# Patient Record
Sex: Male | Born: 1960 | Race: White | Hispanic: No | Marital: Married | Smoking: Current every day smoker
Health system: Southern US, Community
[De-identification: ages and names within clinical notes are randomized; demographics above are authoritative.]

## PROBLEM LIST (undated history)

## (undated) DIAGNOSIS — Z8679 Personal history of other diseases of the circulatory system: Secondary | ICD-10-CM

## (undated) DIAGNOSIS — Z9989 Dependence on other enabling machines and devices: Secondary | ICD-10-CM

## (undated) DIAGNOSIS — I5022 Chronic systolic (congestive) heart failure: Secondary | ICD-10-CM

## (undated) DIAGNOSIS — E119 Type 2 diabetes mellitus without complications: Secondary | ICD-10-CM

## (undated) DIAGNOSIS — F17200 Nicotine dependence, unspecified, uncomplicated: Secondary | ICD-10-CM

## (undated) DIAGNOSIS — G4733 Obstructive sleep apnea (adult) (pediatric): Secondary | ICD-10-CM

## (undated) DIAGNOSIS — I249 Acute ischemic heart disease, unspecified: Secondary | ICD-10-CM

## (undated) HISTORY — DX: Dependence on other enabling machines and devices: Z99.89

## (undated) HISTORY — DX: Nicotine dependence, unspecified, uncomplicated: F17.200

## (undated) HISTORY — DX: Personal history of other diseases of the circulatory system: Z86.79

## (undated) HISTORY — DX: Type 2 diabetes mellitus without complications: E11.9

## (undated) HISTORY — DX: Chronic systolic (congestive) heart failure: I50.22

## (undated) HISTORY — DX: Acute ischemic heart disease, unspecified: I24.9

## (undated) HISTORY — DX: Obstructive sleep apnea (adult) (pediatric): G47.33

## (undated) HISTORY — DX: Morbid (severe) obesity due to excess calories: E66.01

## (undated) HISTORY — PX: TRANSTHORACIC ECHOCARDIOGRAM: SHX275

---

## 2012-12-21 ENCOUNTER — Ambulatory Visit (INDEPENDENT_AMBULATORY_CARE_PROVIDER_SITE_OTHER): Payer: BC Managed Care – PPO | Admitting: Family Medicine

## 2012-12-21 ENCOUNTER — Encounter: Payer: Self-pay | Admitting: Family Medicine

## 2012-12-21 DIAGNOSIS — F17200 Nicotine dependence, unspecified, uncomplicated: Secondary | ICD-10-CM | POA: Insufficient documentation

## 2012-12-21 HISTORY — DX: Morbid (severe) obesity due to excess calories: E66.01

## 2012-12-21 NOTE — Progress Notes (Addendum)
Office Note 12/21/2012  CC:  Chief Complaint  Patient presents with  . Establish Care    HPI:  Jesse Hurst is a 52 y.o. White male who is here to establish care. Patient's most recent primary MD: none Old records were not reviewed prior to or during today's visit.  He is feeling well, has no acute complaints.  Says he will be taking a cruise and he has to have a note saying he does not have any illnesses that would preclude safe travel.  Pt denies any past significant acute or chronic illnesses, hospitalizations, procedures, or surgeries. Past Medical History  Diagnosis Date  . Obesity, morbid 12/21/2012  . Tobacco dependence 12/21/2012    History reviewed. No pertinent past surgical history.  Family History  Problem Relation Age of Onset  . Cancer Mother     Lung (heavy smoker)  d. 41  . Cancer Father     Lung cancer (heavy smoker)  d 76  . Drug abuse Son     History   Social History  . Marital Status: Married    Spouse Name: N/A    Number of Children: N/A  . Years of Education: N/A   Occupational History  . Not on file.   Social History Main Topics  . Smoking status: Current Every Day Smoker -- 1.00 packs/day for 30 years    Types: Cigarettes  . Smokeless tobacco: Never Used  . Alcohol Use: No  . Drug Use: No  . Sexual Activity: Not on file   Other Topics Concern  . Not on file   Social History Narrative   Married, 4 children.  3 living (son died of drug overdose).   Occupation: plumber--owns plumbing business.  Lives in [place], [place].   Education: GED.   Hobbies: Fish and golf.   +Cigarettes: 45 pack-yr hx [place] lights   Alcohol: none   Drugs: none   MEDS: none  No Known Allergies  ROS Review of Systems  Constitutional: Negative for fever and fatigue.  HENT: Negative for congestion and sore throat.   Eyes: Negative for visual disturbance.  Respiratory: Negative for cough.   Cardiovascular: Negative for chest pain.  Gastrointestinal:  Negative for nausea and abdominal pain.  Genitourinary: Negative for dysuria.  Musculoskeletal: Negative for back pain and joint swelling.  Skin: Negative for rash.  Neurological: Negative for weakness and headaches.  Hematological: Negative for adenopathy.     PE; Blood pressure 154/100, pulse 82, temperature 98.9 F (37.2 C), temperature source Temporal, resp. rate 20, height 6\' 1"  (1.854 m), weight 422 lb (191.418 kg), SpO2 95.00%. Initial bp was taken on wrist with an automated bp machine. I checked his bp with manual cuff (extra large) on upper arm and got 124/84 right arm, 120/80 left arm. Gen: Alert, well appearing, obese white male.  Patient is oriented to person, place, time, and situation. ENT: Eyes: no injection, icteris, swelling, or exudate.  EOMI, PERRLA. Nose: no drainage or turbinate edema/swelling.  No injection or focal lesion.  Mouth: lips without lesion/swelling.  Oral mucosa pink and moist.  Dentition intact and without obvious caries or gingival swelling.  Oropharynx without erythema, exudate, or swelling.  Neck - No masses or thyromegaly or limitation in range of motion CV: RRR, no m/r/g.   LUNGS: CTA bilat, nonlabored resps, good aeration in all lung fields. ABD: soft, NT, ND but rotund/obese, BS normal.  No hepatospenomegaly or mass.  No bruits. EXT: no clubbing, cyanosis, or edema.   Pertinent  labs:  none  ASSESSMENT AND PLAN:   New pt: no old records to obtain.  Obesity, morbid Pt saw his weight today and is now motivated to start diet (discussed DASH diet and gave handout) and exercising (walking, treadmill, lifting weights). He says when he puts his mind to something he does it. He'll get the labs he got with his life insurance CPE about 9 mo ago and drop them by our office or mail them to [place]. We'll have him return for fasting CPE in 3 mo--will do fasting HP then and discuss PSA testing and options for colon cancer screening. Letter written for him today  stating he was healthy for travel.  Tobacco dependence Encouraged cessation but this was not up for discussion today. "I like to smoke" was a comment he made. Perhaps after we see some good wt loss we'll try to tackle this problem.  An After Visit Summary was printed and given to the patient.  Pt declined flu vaccine here, says he gets them at pharmacy.  Return in about 3 months (around 03/23/2013) for fasting CPE.

## 2012-12-21 NOTE — Assessment & Plan Note (Signed)
Encouraged cessation but this was not up for discussion today. "I like to smoke" was a comment he made. Perhaps after we see some good wt loss we'll try to tackle this problem.

## 2012-12-21 NOTE — Assessment & Plan Note (Signed)
Pt saw his weight today and is now motivated to start diet (discussed DASH diet and gave handout) and exercising (walking, treadmill, lifting weights). He says when he puts his mind to something he does it. He'll get the labs he got with his life insurance CPE about 9 mo ago and drop them by our office or mail them to [place]. We'll have him return for fasting CPE in 3 mo--will do fasting HP then and discuss PSA testing and options for colon cancer screening. Letter written for him today stating he was healthy for travel.

## 2012-12-24 ENCOUNTER — Encounter: Payer: Self-pay | Admitting: Family Medicine

## 2013-02-14 HISTORY — PX: CARDIAC CATHETERIZATION: SHX172

## 2013-02-22 ENCOUNTER — Inpatient Hospital Stay (HOSPITAL_COMMUNITY)
Admission: EM | Admit: 2013-02-22 | Discharge: 2013-02-24 | DRG: 281 | Disposition: A | Discharge status: Home / Self Care | Payer: BC Managed Care – PPO | Attending: Cardiology | Admitting: Cardiology

## 2013-02-22 DIAGNOSIS — Z6841 Body Mass Index (BMI) 40.0 and over, adult: Secondary | ICD-10-CM

## 2013-02-22 DIAGNOSIS — N19 Unspecified kidney failure: Secondary | ICD-10-CM

## 2013-02-22 DIAGNOSIS — F17200 Nicotine dependence, unspecified, uncomplicated: Secondary | ICD-10-CM | POA: Diagnosis present

## 2013-02-22 DIAGNOSIS — I471 Supraventricular tachycardia, unspecified: Secondary | ICD-10-CM | POA: Diagnosis present

## 2013-02-22 DIAGNOSIS — I249 Acute ischemic heart disease, unspecified: Secondary | ICD-10-CM | POA: Diagnosis present

## 2013-02-22 DIAGNOSIS — I214 Non-ST elevation (NSTEMI) myocardial infarction: Principal | ICD-10-CM | POA: Diagnosis present

## 2013-02-22 HISTORY — DX: Acute ischemic heart disease, unspecified: I24.9

## 2013-02-23 ENCOUNTER — Emergency Department (HOSPITAL_COMMUNITY): Payer: BC Managed Care – PPO

## 2013-02-23 ENCOUNTER — Inpatient Hospital Stay (HOSPITAL_COMMUNITY): Payer: BC Managed Care – PPO

## 2013-02-23 ENCOUNTER — Encounter (HOSPITAL_COMMUNITY): Payer: Self-pay | Admitting: Emergency Medicine

## 2013-02-23 DIAGNOSIS — I249 Acute ischemic heart disease, unspecified: Secondary | ICD-10-CM | POA: Diagnosis present

## 2013-02-23 LAB — PRO B NATRIURETIC PEPTIDE
Pro B Natriuretic peptide (BNP): 1106 pg/mL — ABNORMAL HIGH (ref 0–125)
Pro B Natriuretic peptide (BNP): 1115 pg/mL — ABNORMAL HIGH (ref 0–125)

## 2013-02-23 LAB — POCT I-STAT, CHEM 8
BUN: 17 mg/dL (ref 6–23)
Calcium, Ion: 1.17 mmol/L (ref 1.12–1.23)
Chloride: 105 mEq/L (ref 96–112)
Creatinine, Ser: 1.5 mg/dL — ABNORMAL HIGH (ref 0.50–1.35)
Sodium: 141 mEq/L (ref 135–145)

## 2013-02-23 LAB — COMPREHENSIVE METABOLIC PANEL
ALT: 18 U/L (ref 0–53)
AST: 20 U/L (ref 0–37)
Albumin: 3.3 g/dL — ABNORMAL LOW (ref 3.5–5.2)
Alkaline Phosphatase: 71 U/L (ref 39–117)
CO2: 26 mEq/L (ref 19–32)
Calcium: 8.5 mg/dL (ref 8.4–10.5)
Chloride: 103 mEq/L (ref 96–112)
GFR calc Af Amer: >90 mL/min (ref >90)
GFR calc non Af Amer: >90 mL/min (ref >90)
Glucose, Bld: 119 mg/dL — ABNORMAL HIGH (ref 70–99)
Potassium: 3.9 mEq/L (ref 3.5–5.1)
Sodium: 138 mEq/L (ref 135–145)
Total Protein: 7 g/dL (ref 6.0–8.3)

## 2013-02-23 LAB — BASIC METABOLIC PANEL
CO2: 25 mEq/L (ref 19–32)
Chloride: 107 mEq/L (ref 96–112)
Creatinine, Ser: 1.13 mg/dL (ref 0.50–1.35)
GFR calc Af Amer: 85 mL/min — ABNORMAL LOW (ref >90)
Glucose, Bld: 108 mg/dL — ABNORMAL HIGH (ref 70–99)
Potassium: 4.2 mEq/L (ref 3.5–5.1)
Sodium: 138 mEq/L (ref 135–145)

## 2013-02-23 LAB — TSH
TSH: 3.654 u[IU]/mL (ref 0.350–4.500)
TSH: 1.509 u[IU]/mL (ref 0.350–4.500)

## 2013-02-23 LAB — CBC WITH DIFFERENTIAL/PLATELET
Basophils Absolute: 0.1 10*3/uL (ref 0.0–0.1)
Eosinophils Relative: 2 % (ref 0–5)
HCT: 44.1 % (ref 39.0–52.0)
Hemoglobin: 13.7 g/dL (ref 13.0–17.0)
Hemoglobin: 14.7 g/dL (ref 13.0–17.0)
Lymphocytes Relative: 22 % (ref 12–46)
Lymphocytes Relative: 33 % (ref 12–46)
Lymphs Abs: 3 10*3/uL (ref 0.7–4.0)
Lymphs Abs: 3.5 10*3/uL (ref 0.7–4.0)
MCV: 90.6 fL (ref 78.0–100.0)
MCV: 91 fL (ref 78.0–100.0)
Monocytes Relative: 9 % (ref 3–12)
Neutro Abs: 9.1 10*3/uL — ABNORMAL HIGH (ref 1.7–7.7)
Neutrophils Relative %: 59 % (ref 43–77)
Platelets: 264 10*3/uL (ref 150–400)
Platelets: 305 10*3/uL (ref 150–400)
RBC: 4.55 MIL/uL (ref 4.22–5.81)
RBC: 4.87 MIL/uL (ref 4.22–5.81)
RDW: 14.2 % (ref 11.5–15.5)
WBC: 10.9 10*3/uL — ABNORMAL HIGH (ref 4.0–10.5)
WBC: 13.6 10*3/uL — ABNORMAL HIGH (ref 4.0–10.5)

## 2013-02-23 LAB — POCT I-STAT TROPONIN I: Troponin i, poc: 0.66 ng/mL (ref 0.00–0.08)

## 2013-02-23 LAB — TROPONIN I
Troponin I: 1.67 ng/mL (ref <0.30)
Troponin I: 0.96 ng/mL (ref <0.30)

## 2013-02-23 LAB — LIPID PANEL
LDL Cholesterol: 86 mg/dL (ref 0–99)
Total CHOL/HDL Ratio: 5.2 RATIO
VLDL: 20 mg/dL (ref 0–40)

## 2013-02-23 LAB — PROTIME-INR
INR: 1.14 (ref 0.00–1.49)
Prothrombin Time: 14.4 seconds (ref 11.6–15.2)
Prothrombin Time: 14.4 seconds (ref 11.6–15.2)

## 2013-02-23 LAB — CBC
MCV: 90.7 fL (ref 78.0–100.0)
Platelets: 276 10*3/uL (ref 150–400)
RBC: 4.39 MIL/uL (ref 4.22–5.81)
WBC: 12.1 10*3/uL — ABNORMAL HIGH (ref 4.0–10.5)

## 2013-02-23 LAB — APTT: aPTT: 45 seconds — ABNORMAL HIGH (ref 24–37)

## 2013-02-23 LAB — D-DIMER, QUANTITATIVE: D-Dimer, Quant: 2.13 ug/mL-FEU — ABNORMAL HIGH (ref 0.00–0.48)

## 2013-02-23 MED ORDER — NITROGLYCERIN IN D5W 200-5 MCG/ML-% IV SOLN: 5.0000 ug/min | INTRAVENOUS | Status: DC

## 2013-02-23 MED ORDER — DIAZEPAM 5 MG PO TABS
5.0000 mg | ORAL_TABLET | ORAL | Status: AC
  Administered 2013-02-24: 5 mg via ORAL
  Filled 2013-02-23: qty 1

## 2013-02-23 MED ORDER — ACETAMINOPHEN 325 MG PO TABS: 650.0000 mg | ORAL_TABLET | ORAL | Status: DC | PRN

## 2013-02-23 MED ORDER — HEPARIN (PORCINE) IN NACL 100-0.45 UNIT/ML-% IJ SOLN
1500.0000 [IU]/h | INTRAMUSCULAR | Status: DC
  Administered 2013-02-23: 1500 [IU]/h via INTRAVENOUS
  Filled 2013-02-23 (×2): qty 250

## 2013-02-23 MED ORDER — SODIUM CHLORIDE 0.9 % IJ SOLN
3.0000 mL | Freq: Two times a day (BID) | INTRAMUSCULAR | Status: DC
  Administered 2013-02-23: 3 mL via INTRAVENOUS

## 2013-02-23 MED ORDER — PANTOPRAZOLE SODIUM 40 MG PO TBEC
40.0000 mg | DELAYED_RELEASE_TABLET | Freq: Every day | ORAL | Status: DC
  Administered 2013-02-23 – 2013-02-24 (×2): 40 mg via ORAL
  Filled 2013-02-23 (×2): qty 1

## 2013-02-23 MED ORDER — HEPARIN BOLUS VIA INFUSION
3500.0000 [IU] | Freq: Once | INTRAVENOUS | Status: AC
  Administered 2013-02-23: 3500 [IU] via INTRAVENOUS
  Filled 2013-02-23: qty 3500

## 2013-02-23 MED ORDER — SODIUM CHLORIDE 0.9 % IV SOLN: 250.0000 mL | INTRAVENOUS | Status: DC | PRN

## 2013-02-23 MED ORDER — ATORVASTATIN CALCIUM 80 MG PO TABS
80.0000 mg | ORAL_TABLET | Freq: Every day | ORAL | Status: DC
  Administered 2013-02-23: 80 mg via ORAL
  Filled 2013-02-23 (×2): qty 1

## 2013-02-23 MED ORDER — NITROGLYCERIN 0.4 MG SL SUBL: 0.4000 mg | SUBLINGUAL_TABLET | SUBLINGUAL | Status: DC | PRN

## 2013-02-23 MED ORDER — SODIUM CHLORIDE 0.9 % IV BOLUS (SEPSIS): 500.0000 mL | Freq: Once | INTRAVENOUS | Status: DC

## 2013-02-23 MED ORDER — SODIUM CHLORIDE 0.9 % IV SOLN: INTRAVENOUS | Status: DC

## 2013-02-23 MED ORDER — HEPARIN (PORCINE) IN NACL 100-0.45 UNIT/ML-% IJ SOLN
2300.0000 [IU]/h | INTRAMUSCULAR | Status: DC
  Administered 2013-02-23: 2000 [IU]/h via INTRAVENOUS
  Administered 2013-02-23 (×2): 2300 [IU]/h via INTRAVENOUS
  Filled 2013-02-23: qty 250

## 2013-02-23 MED ORDER — ASPIRIN EC 81 MG PO TBEC
81.0000 mg | DELAYED_RELEASE_TABLET | Freq: Every day | ORAL | Status: DC
  Filled 2013-02-23: qty 1

## 2013-02-23 MED ORDER — METOPROLOL TARTRATE 12.5 MG HALF TABLET
12.5000 mg | ORAL_TABLET | Freq: Two times a day (BID) | ORAL | Status: DC
  Administered 2013-02-23 – 2013-02-24 (×3): 12.5 mg via ORAL
  Filled 2013-02-23 (×5): qty 1

## 2013-02-23 MED ORDER — IOHEXOL 350 MG/ML SOLN
100.0000 mL | Freq: Once | INTRAVENOUS | Status: AC | PRN
  Administered 2013-02-23: 100 mL via INTRAVENOUS

## 2013-02-23 MED ORDER — ONDANSETRON HCL 4 MG/2ML IJ SOLN: 4.0000 mg | Freq: Four times a day (QID) | INTRAMUSCULAR | Status: DC | PRN

## 2013-02-23 MED ORDER — ASPIRIN 300 MG RE SUPP: 300.0000 mg | RECTAL | Status: AC

## 2013-02-23 MED ORDER — SODIUM CHLORIDE 0.9 % IV BOLUS (SEPSIS)
500.0000 mL | Freq: Once | INTRAVENOUS | Status: AC
  Administered 2013-02-23: 500 mL via INTRAVENOUS

## 2013-02-23 MED ORDER — ASPIRIN 81 MG PO CHEW: 324.0000 mg | CHEWABLE_TABLET | Freq: Once | ORAL | Status: DC

## 2013-02-23 MED ORDER — SODIUM CHLORIDE 0.9 % IJ SOLN: 3.0000 mL | INTRAMUSCULAR | Status: DC | PRN

## 2013-02-23 MED ORDER — DIAZEPAM 5 MG PO TABS: 5.0000 mg | ORAL_TABLET | ORAL | Status: DC

## 2013-02-23 MED ORDER — ASPIRIN 81 MG PO CHEW
81.0000 mg | CHEWABLE_TABLET | ORAL | Status: AC
  Administered 2013-02-24: 81 mg via ORAL
  Filled 2013-02-23: qty 1

## 2013-02-23 MED ORDER — HEPARIN BOLUS VIA INFUSION
4000.0000 [IU] | Freq: Once | INTRAVENOUS | Status: AC
  Administered 2013-02-23: 4000 [IU] via INTRAVENOUS
  Filled 2013-02-23: qty 4000

## 2013-02-23 MED ORDER — NITROGLYCERIN IN D5W 200-5 MCG/ML-% IV SOLN
10.0000 ug/min | INTRAVENOUS | Status: DC
  Administered 2013-02-23: 10 ug/min via INTRAVENOUS
  Filled 2013-02-23: qty 250

## 2013-02-23 MED ORDER — ASPIRIN 81 MG PO CHEW
324.0000 mg | CHEWABLE_TABLET | ORAL | Status: AC
  Administered 2013-02-23: 324 mg via ORAL
  Filled 2013-02-23: qty 4

## 2013-02-23 MED ORDER — METOPROLOL TARTRATE 12.5 MG HALF TABLET
12.5000 mg | ORAL_TABLET | Freq: Two times a day (BID) | ORAL | Status: DC
  Filled 2013-02-23: qty 1

## 2013-02-23 MED ORDER — HEPARIN BOLUS VIA INFUSION
3000.0000 [IU] | Freq: Once | INTRAVENOUS | Status: AC
  Administered 2013-02-23: 3000 [IU] via INTRAVENOUS
  Filled 2013-02-23: qty 3000

## 2013-02-23 NOTE — ED Notes (Signed)
Notified Md of dropping BP

## 2013-02-23 NOTE — H&P (Signed)
Jesse Hurst is an 52 y.o. male.   Chief Complaint: Palpitation associated with shortness of breath and diaphoresis HPI: Patient is 52 year old male with no significant past medical history except for tobacco abuse and morbid obesity came to the ER by EMS complaining of dizziness shortness of breath and palpitation off and on since yesterday afternoon called EMS and was noted to be in supraventricular tachycardia with heart rate in 200s and spontaneously converted to normal sinus rhythm. Patient denies any chest pain nausea or vomiting. Denies history of palpitations or shortness of breath in the past he did denies PND orthopnea leg swelling. Denies any prolonged immobilization. EKG done in the ER course cardioversion showed no acute ischemic changes but patient was noted to have a mildly elevated troponin I. of 1.6. Patient denies any cardiac workup in the past.  Past Medical History  Diagnosis Date  . Obesity, morbid 12/21/2012  . Tobacco dependence 12/21/2012    History reviewed. No pertinent past surgical history.  Family History  Problem Relation Age of Onset  . Cancer Mother     Lung (heavy smoker)  d. 49  . Cancer Father     Lung cancer (heavy smoker)  d 85  . Drug abuse Son    Social History:  reports that he has been smoking Cigarettes.  He has a 30 pack-year smoking history. He has never used smokeless tobacco. He reports that he does not drink alcohol or use illicit drugs.  Allergies: No Known Allergies   (Not in a hospital admission)  Results for orders placed during the hospital encounter of 02/22/13 (from the past 48 hour(s))  CBC WITH DIFFERENTIAL     Status: Abnormal   Collection Time    02/23/13 12:05 AM      Result Value Range   WBC 13.6 (*) 4.0 - 10.5 K/uL   RBC 4.87  4.22 - 5.81 MIL/uL   Hemoglobin 14.7  13.0 - 17.0 g/dL   HCT 16.1  09.6 - 04.5 %   MCV 90.6  78.0 - 100.0 fL   MCH 30.2  26.0 - 34.0 pg   MCHC 33.3  30.0 - 36.0 g/dL   RDW 40.9  81.1 - 91.4 %   Platelets 305  150 - 400 K/uL   Neutrophils Relative % 67  43 - 77 %   Neutro Abs 9.1 (*) 1.7 - 7.7 K/uL   Lymphocytes Relative 22  12 - 46 %   Lymphs Abs 3.0  0.7 - 4.0 K/uL   Monocytes Relative 9  3 - 12 %   Monocytes Absolute 1.2 (*) 0.1 - 1.0 K/uL   Eosinophils Relative 2  0 - 5 %   Eosinophils Absolute 0.2  0.0 - 0.7 K/uL   Basophils Relative 0  0 - 1 %   Basophils Absolute 0.1  0.0 - 0.1 K/uL  PRO B NATRIURETIC PEPTIDE     Status: Abnormal   Collection Time    02/23/13 12:05 AM      Result Value Range   Pro B Natriuretic peptide (BNP) 1106.0 (*) 0 - 125 pg/mL  POCT I-STAT TROPONIN I     Status: Abnormal   Collection Time    02/23/13 12:11 AM      Result Value Range   Troponin i, poc 0.66 (*) 0.00 - 0.08 ng/mL   Comment NOTIFIED PHYSICIAN     Comment 3            Comment: Due to the release kinetics of  cTnI,     a negative result within the first hours     of the onset of symptoms does not rule out     myocardial infarction with certainty.     If myocardial infarction is still suspected,     repeat the test at appropriate intervals.  POCT I-STAT, CHEM 8     Status: Abnormal   Collection Time    02/23/13 12:13 AM      Result Value Range   Sodium 141  135 - 145 mEq/L   Potassium 4.3  3.5 - 5.1 mEq/L   Chloride 105  96 - 112 mEq/L   BUN 17  6 - 23 mg/dL   Creatinine, Ser 4.09 (*) 0.50 - 1.35 mg/dL   Glucose, Bld 811 (*) 70 - 99 mg/dL   Calcium, Ion 9.14  7.82 - 1.23 mmol/L   TCO2 24  0 - 100 mmol/L   Hemoglobin 16.0  13.0 - 17.0 g/dL   HCT 95.6  21.3 - 08.6 %  TROPONIN I     Status: Abnormal   Collection Time    02/23/13 12:23 AM      Result Value Range   Troponin I 1.67 (*) <0.30 ng/mL   Comment:            Due to the release kinetics of cTnI,     a negative result within the first hours     of the onset of symptoms does not rule out     myocardial infarction with certainty.     If myocardial infarction is still suspected,     repeat the test at appropriate  intervals.     CRITICAL RESULT CALLED TO, READ BACK BY AND VERIFIED WITH:     NEGRON,J RN 02/23/2013 0117 JORDANS     REPEATED TO VERIFY   Dg Chest 2 View  02/23/2013   CLINICAL DATA:  Shortness of breath, chest pain.  EXAM: CHEST  2 VIEW  COMPARISON:  None available for comparison at time of study interpretation.  FINDINGS: Cardiomediastinal silhouette is unremarkable. The lungs are clear without pleural effusions or focal consolidations. Pulmonary vasculature is unremarkable. Trachea projects midline and there is no pneumothorax. Soft tissue planes and included osseous structures are nonsuspicious. Large body habitus.  IMPRESSION: No active cardiopulmonary disease.   Electronically Signed   By: Awilda Metro   On: 02/23/2013 00:58    Review of Systems  Constitutional: Negative for fever, chills and weight loss.  HENT: Negative for hearing loss.   Eyes: Negative for blurred vision.  Respiratory: Positive for shortness of breath. Negative for cough, hemoptysis and sputum production.   Cardiovascular: Positive for palpitations. Negative for chest pain, orthopnea, claudication, leg swelling and PND.  Gastrointestinal: Negative for nausea and vomiting.  Genitourinary: Negative for dysuria.  Neurological: Positive for dizziness. Negative for headaches.    Blood pressure 107/71, pulse 84, temperature 98.3 F (36.8 C), temperature source Oral, resp. rate 21, SpO2 96.00%. Physical Exam  Constitutional: He is oriented to person, place, and time. He appears well-developed and well-nourished.  HENT:  Head: Normocephalic and atraumatic.  Eyes: Conjunctivae are normal. Pupils are equal, round, and reactive to light. Left eye exhibits no discharge. No scleral icterus.  Neck: Normal range of motion. Neck supple. No JVD present. No tracheal deviation present. No thyromegaly present.  Cardiovascular: Normal rate and regular rhythm.  Exam reveals no friction rub.   No murmur heard. Respiratory:  Breath sounds normal. No respiratory distress. He  has no wheezes. He has no rales.  GI: Soft. Bowel sounds are normal. He exhibits distension. There is no tenderness. There is no rebound and no guarding.  Musculoskeletal: He exhibits no edema.  Neurological: He is alert and oriented to person, place, and time.     Assessment/Plan Probable small non-Q-wave myocardial infarction secondary to paroxysmal SVT due to demand ischemia rule out coronary artery disease Morbid obesity Tobacco abuse Plan Check serial enzymes and EKG Check 2-D echo  Discussed with patient regarding various options of treatment i.e. medical versus noninvasive stress testing versus cardiac cath and also regarding SVT ablation versus medical management his risk and benefits patient presently evening towards medical management only.  Lyle Leisner N 02/23/2013, 2:38 AM

## 2013-02-23 NOTE — Care Management Note (Signed)
    Page 1 of 1   02/23/2013     1:19:33 PM   CARE MANAGEMENT NOTE 02/23/2013  Patient:  Jesse Hurst, Jesse Hurst   Account Number:  [id number]  Date Initiated:  02/23/2013  Documentation initiated by:  Junius Creamer  Subjective/Objective Assessment:   adm w mi     Action/Plan:   lives w wife, pcp dr Horatio Pel   Anticipated DC Date:     Anticipated DC Plan:           Choice offered to / List presented to:             Status of service:   Medicare Important Message given?   (If response is "NO", the following Medicare IM given date fields will be blank) Date Medicare IM given:   Date Additional Medicare IM given:    Discharge Disposition:    Per UR Regulation:  Reviewed for med. necessity/level of care/duration of stay  If discussed at Long Length of Stay Meetings, dates discussed:    Comments:

## 2013-02-23 NOTE — Progress Notes (Signed)
ANTICOAGULATION CONSULT NOTE - Follow Up Consult  Pharmacy Consult:  Heparin Indication: chest pain/ACS  No Known Allergies  Patient Measurements: Height: 6' 0.83" (185 cm) Weight: 421 lb 15.4 oz (191.4 kg) IBW/kg (Calculated) : 79.52 Heparin Dosing Weight: 127 kg   Vital Signs: Temp: 98.1 F (36.7 C) (12/10 0724) Temp src: Oral (12/10 0724) BP: 109/70 mmHg (12/10 1200) Pulse Rate: 81 (12/10 1200)   Labs:  Recent Labs  02/23/13 0005 02/23/13 0013 02/23/13 0023 02/23/13 0517 02/23/13 0810 02/23/13 1100  HGB 14.7 16.0  --  13.0  --  13.7  HCT 44.1 47.0  --  39.8  --  41.4  PLT 305  --   --  276  --  264  APTT  --   --   --  45*  --   --   LABPROT  --   --   --  14.4  --   --   INR  --   --   --  1.14  --   --   HEPARINUNFRC  --   --   --   --  <0.10*  --   CREATININE  --  1.50*  --  1.13  --  0.98  TROPONINI  --   --  1.67*  --   --   --     Estimated Creatinine Clearance: 155 ml/min (by C-G formula based on Cr of 0.98).      Assessment: 51 YOM admitted with palpitations associated with SOB and diaphoresis.  He continues on IV heparin for ACS and heparin level is sub-therapeutic.  No complications with heparin infusion per RN.  No bleeding reported.   Goal of Therapy:  Heparin level 0.3-0.7 units/ml Monitor platelets by anticoagulation protocol: Yes    Plan:  - Heparin 3500 units IV bolus, then - Increase heparin gtt to 2000 units/hr - Check 6 hr HL - Daily HL / CBC    Bryar Rennie D. Laney Potash, PharmD, BCPS Pager:  [phone] 02/23/2013, 12:49 PM

## 2013-02-23 NOTE — ED Notes (Signed)
Notified MD(Miller) and RN Melony Overly) of elevated Istat-trop. of 0.66

## 2013-02-23 NOTE — Progress Notes (Signed)
ANTICOAGULATION CONSULT NOTE - Follow Up Consult  Pharmacy Consult:  Heparin Indication: chest pain/ACS  No Known Allergies  Labs:  Recent Labs  02/23/13 0005 02/23/13 0013 02/23/13 0023 02/23/13 0517 02/23/13 0810 02/23/13 1100 02/23/13 1345 02/23/13 1931  HGB 14.7 16.0  --  13.0  --  13.7  --   --   HCT 44.1 47.0  --  39.8  --  41.4  --   --   PLT 305  --   --  276  --  264  --   --   APTT  --   --   --  45*  --   --   --   --   LABPROT  --   --   --  14.4  --   --  14.4  --   INR  --   --   --  1.14  --   --  1.14  --   HEPARINUNFRC  --   --   --   --  <0.10*  --   --  0.19*  CREATININE  --  1.50*  --  1.13  --  0.98  --   --   TROPONINI  --   --  1.67*  --   --   --  1.41*  --     Estimated Creatinine Clearance: 148.5 ml/min (by C-G formula based on Cr of 0.98).  Assessment: 68 YOM admitted with palpitations associated with SOB and diaphoresis.  He continues on IV heparin for ACS and heparin level is sub-therapeutic.    Goal of Therapy:  Heparin level 0.3-0.7 units/ml Monitor platelets by anticoagulation protocol: Yes    Plan:  - Heparin 3000 units IV bolus, then - Increase heparin gtt to 2300 units/hr - Check 6 hr HL - Daily HL / CBC  Thank you Okey Regal, PharmD [phone]  02/23/2013, 8:11 PM

## 2013-02-23 NOTE — ED Notes (Addendum)
Pt to ED via EMS with c/o palpitations, SOB, dizziness and nausea. Per EMS, initial HR-202 then HR93-sinus, BP-126/84. Pt alert and oriented x4 and arrival. .

## 2013-02-23 NOTE — ED Provider Notes (Signed)
CSN: [id number]     Arrival date & time 02/22/13  2348 History   First MD Initiated Contact with Patient 02/22/13 2350     Chief Complaint  Patient presents with  . Palpitations  . Shortness of Breath   (Consider location/radiation/quality/duration/timing/severity/associated sxs/prior Treatment) HPI Comments: 52 year old male with a history of morbid obesity and tobacco use who presents by ambulance after feeling short of breath and diaphoretic. He states this began shortly after eating lunch today, the initial symptoms were dizziness followed by an supraumbilical discomfort, shortness of breath and intermittent diaphoresis. Though the dizziness and abdominal pain has completely resolved the shortness of breath has persisted throughout the day, not associated with chest pain palpitations or swelling in the lower extremities. The paramedics found the patient to have a heart rate of approximately 200 beats per minute which was captured on their rhythm strip, when the patient stood up to walk to the stretcher his heart rate dropped to just under 100 beats per minute. They also captured this as well. The patient denies swelling of the lower extremities, chest pain, any history of coronary disease or any other significant medical problems, recently had a physical exam and workup from the insurance company showing normal cholesterol, no diabetes or significant hypertension. He did take a recent cruise around the [place] but was active during that time and has recently intentionally lost weight up to 20 pounds. He denies unexpected weight loss, fevers, thermal this regulation, diarrhea or any other signs of hyperthyroidism. He denies new medications or over-the-counter medications and takes no alcohol, stimulants or drugs. At this time the patient's symptoms are mild but persistent.  The history is provided by the patient, the spouse and the EMS personnel.    Past Medical History  Diagnosis Date  .  Obesity, morbid 12/21/2012  . Tobacco dependence 12/21/2012   History reviewed. No pertinent past surgical history. Family History  Problem Relation Age of Onset  . Cancer Mother     Lung (heavy smoker)  d. 43  . Cancer Father     Lung cancer (heavy smoker)  d 32  . Drug abuse Son    History  Substance Use Topics  . Smoking status: Current Every Day Smoker -- 1.00 packs/day for 30 years    Types: Cigarettes  . Smokeless tobacco: Never Used  . Alcohol Use: No    Review of Systems  All other systems reviewed and are negative.    Allergies  Review of patient's allergies indicates no known allergies.  Home Medications   Current Outpatient Rx  Name  Route  Sig  Dispense  Refill  . aspirin 81 MG chewable tablet   Oral   Chew 324 mg by mouth once.          BP 115/76  Pulse 91  Temp(Src) 98.3 F (36.8 C) (Oral)  Resp 12  SpO2 95% Physical Exam  Nursing note and vitals reviewed. Constitutional: He appears well-developed and well-nourished. No distress.  Morbidly obese  HENT:  Head: Normocephalic and atraumatic.  Mouth/Throat: Oropharynx is clear and moist. No oropharyngeal exudate.  Eyes: Conjunctivae and EOM are normal. Pupils are equal, round, and reactive to light. Right eye exhibits no discharge. Left eye exhibits no discharge. No scleral icterus.  Neck: Normal range of motion. Neck supple. No JVD present. No thyromegaly present.  Cardiovascular: Normal rate, regular rhythm, normal heart sounds and intact distal pulses.  Exam reveals no gallop and no friction rub.   No murmur heard.  Pulmonary/Chest: Effort normal and breath sounds normal. No respiratory distress. He has no wheezes. He has no rales.  Abdominal: Soft. Bowel sounds are normal. He exhibits no distension and no mass. There is no tenderness.  Obese abdomen, nontender, no masses  Musculoskeletal: Normal range of motion. He exhibits no edema and no tenderness.  Lymphadenopathy:    He has no cervical  adenopathy.  Neurological: He is alert. Coordination normal.  Skin: Skin is warm and dry. No rash noted. No erythema.  Psychiatric: He has a normal mood and affect. His behavior is normal.    ED Course  Procedures (including critical care time) Labs Review Labs Reviewed  CBC WITH DIFFERENTIAL - Abnormal; Notable for the following:    WBC 13.6 (*)    Neutro Abs 9.1 (*)    Monocytes Absolute 1.2 (*)    All other components within normal limits  PRO B NATRIURETIC PEPTIDE - Abnormal; Notable for the following:    Pro B Natriuretic peptide (BNP) 1106.0 (*)    All other components within normal limits  TROPONIN I - Abnormal; Notable for the following:    Troponin I 1.67 (*)    All other components within normal limits  POCT I-STAT, CHEM 8 - Abnormal; Notable for the following:    Creatinine, Ser 1.50 (*)    Glucose, Bld 128 (*)    All other components within normal limits  POCT I-STAT TROPONIN I - Abnormal; Notable for the following:    Troponin i, poc 0.66 (*)    All other components within normal limits  TSH   Imaging Review Dg Chest 2 View  02/23/2013   CLINICAL DATA:  Shortness of breath, chest pain.  EXAM: CHEST  2 VIEW  COMPARISON:  None available for comparison at time of study interpretation.  FINDINGS: Cardiomediastinal silhouette is unremarkable. The lungs are clear without pleural effusions or focal consolidations. Pulmonary vasculature is unremarkable. Trachea projects midline and there is no pneumothorax. Soft tissue planes and included osseous structures are nonsuspicious. Large body habitus.  IMPRESSION: No active cardiopulmonary disease.   Electronically Signed   By: Awilda Metro   On: 02/23/2013 00:58    EKG Interpretation   None       MDM   1. NSTEMI (non-ST elevated myocardial infarction)   2. Renal failure    The patient has an essentially normal physical exam at this time, his heart sounds are distant but given the size of his chest wall there is no  obvious arrhythmias or significant murmurs. His lung sounds are clear, there is no peripheral edema to speak of and no asymmetry of the legs. The only cardiac monitoring and evaluation of the possible source of what appears to be SVT on the paramedics tracing.  Laboratory results show that the patient has renal failure with a creatinine of 1.5, white blood cell count of 13.6 and a BNP of 1100. His troponin is elevated at 1.6 consistent with acute coronary syndrome and a non-ST elevation MI. The chest x-ray does not show any significant signs of acute congestive heart failure.  Due to the patient's obesity, his smoking, his symptoms and his troponin it is likely that he is having a non-ST elevation MI and ask cardiology was consulted, recommended heparin drip, nitroglycerin drip and will come to admit the patient to hospital.  D/w Dr. Sharyn Lull who will admit  CRITICAL CARE Performed by: Vida Roller Total critical care time: 35 Critical care time was exclusive of separately billable procedures  and treating other patients. Critical care was necessary to treat or prevent imminent or life-threatening deterioration. Critical care was time spent personally by me on the following activities: development of treatment plan with patient and/or surrogate as well as nursing, discussions with consultants, evaluation of patient's response to treatment, examination of patient, obtaining history from patient or surrogate, ordering and performing treatments and interventions, ordering and review of laboratory studies, ordering and review of radiographic studies, pulse oximetry and re-evaluation of patient's condition.   Vida Roller, MD 02/23/13 [phone]

## 2013-02-23 NOTE — Progress Notes (Signed)
ANTICOAGULATION CONSULT NOTE - Initial Consult  Pharmacy Consult for Heparin  Indication: chest pain/ACS  No Known Allergies  Patient Measurements: Heparin Dosing Weight: ~127 kg  Vital Signs: Temp: 98.3 F (36.8 C) (12/10 0006) Temp src: Oral (12/10 0006) BP: 100/54 mmHg (12/10 0430) Pulse Rate: 73 (12/10 0430)  Labs:  Recent Labs  02/23/13 0005 02/23/13 0013 02/23/13 0023  HGB 14.7 16.0  --   HCT 44.1 47.0  --   PLT 305  --   --   CREATININE  --  1.50*  --   TROPONINI  --   --  1.67*   Medical History: Past Medical History  Diagnosis Date  . Obesity, morbid 12/21/2012  . Tobacco dependence 12/21/2012   Assessment: 52 y/o M initially started on heparin per MD in the ED, now heparin per Rx. Got a 4000 units BOLUS of heparin and started on 1500 units/hr of heparin.   Goal of Therapy:  Heparin level 0.3-0.7 units/ml Monitor platelets by anticoagulation protocol: Yes   Plan:  -Continue heparin drip at 1500 units/hr -Check HL at 1000 -Daily CBC/HL -Monitor for bleeding -F/U cardiology plans  Thank you for allowing me to take part in this patient's care,  Abran Duke, PharmD Clinical Pharmacist Phone: [phone] Pager: [phone] 02/23/2013 4:53 AM

## 2013-02-23 NOTE — Progress Notes (Signed)
Subjective:  Patient denies any chest pain states breathing has improved. Patient was noted to have mildly elevated d-dimer subsequently had CT of the chest which was negative for pulmonary embolism. Cardiac enzymes are trending down. No further episodes of SVT on the monitor. 2-D echo showed mildly depressed LV systolic function regional wall motion abnormalities could not be assessed adequately due to poor windows and marked obesity.  Objective:  Vital Signs in the last 24 hours: Temp:  [97.6 F (36.4 C)-98.3 F (36.8 C)] 97.6 F (36.4 C) (12/10 1654) Pulse Rate:  [66-91] 73 (12/10 1400) Resp:  [12-27] 25 (12/10 1400) BP: (89-131)/(41-82) 112/59 mmHg (12/10 1654) SpO2:  [94 %-98 %] 96 % (12/10 1654) Weight:  [170.9 kg (376 lb 12.3 oz)-191.4 kg (421 lb 15.4 oz)] 170.9 kg (376 lb 12.3 oz) (12/10 1235)  Intake/Output from previous day:   Intake/Output from this shift: Total I/O In: 40 [I.V.:40] Out: -   Physical Exam: Neck: no adenopathy, no carotid bruit, no JVD and supple, symmetrical, trachea midline Lungs: clear to auscultation bilaterally Heart: regular rate and rhythm, S1, S2 normal and Soft systolic murmur noted Abdomen: soft, non-tender; bowel sounds normal; no masses,  no organomegaly Extremities: extremities normal, atraumatic, no cyanosis or edema  Lab Results:  Recent Labs  02/23/13 0517 02/23/13 1100  WBC 12.1* 10.9*  HGB 13.0 13.7  PLT 276 264    Recent Labs  02/23/13 0517 02/23/13 1100  NA 138 138  K 4.2 3.9  CL 107 103  CO2 25 26  GLUCOSE 108* 119*  BUN 16 14  CREATININE 1.13 0.98    Recent Labs  02/23/13 0023 02/23/13 1345  TROPONINI 1.67* 1.41*   Hepatic Function Panel  Recent Labs  02/23/13 1100  PROT 7.0  ALBUMIN 3.3*  AST 20  ALT 18  ALKPHOS 71  BILITOT 0.5    Recent Labs  02/23/13 0517  CHOL 131   No results found for this basename: PROTIME,  in the last 72 hours  Imaging: Imaging results have been reviewed and Dg  Chest 2 View  02/23/2013   CLINICAL DATA:  Shortness of breath, chest pain.  EXAM: CHEST  2 VIEW  COMPARISON:  None available for comparison at time of study interpretation.  FINDINGS: Cardiomediastinal silhouette is unremarkable. The lungs are clear without pleural effusions or focal consolidations. Pulmonary vasculature is unremarkable. Trachea projects midline and there is no pneumothorax. Soft tissue planes and included osseous structures are nonsuspicious. Large body habitus.  IMPRESSION: No active cardiopulmonary disease.   Electronically Signed   By: Awilda Metro   On: 02/23/2013 00:58   Ct Angio Chest W/cm &/or Wo Cm  02/23/2013   CLINICAL DATA:  Palpitations, shortness of breath, dizziness  EXAM: CT ANGIOGRAPHY CHEST WITH CONTRAST  TECHNIQUE: Multidetector CT imaging of the chest was performed using the standard protocol during bolus administration of intravenous contrast. Multiplanar CT image reconstructions including MIPs were obtained to evaluate the vascular anatomy.  CONTRAST:  OMNIPAQUE IOHEXOL 350 MG/ML SOLN  COMPARISON:  None.  FINDINGS: Limited exam because of patient body habitus and distal pulmonary arterial opacification. No significant large central or proximal hilar pulmonary embolus identified by CTA. Normal heart size. No adenopathy. No pericardial or pleural effusion.  Lung windows demonstrate tiny right apical subpleural bleb measuring 2 cm, image 11. Minor bibasilar atelectasis. No focal pneumonia, collapse or consolidation. No edema or interstitial process.  Degenerative changes noted of the thoracic spine with large osteophytes on the right.  Included upper abdomen demonstrates no acute finding.  Review of the MIP images confirms the above findings.  IMPRESSION: No significant acute central or proximal hilar pulmonary embolus.  Basilar atelectasis  No acute intra thoracic finding   Electronically Signed   By: Ruel Favors M.D.   On: 02/23/2013 09:35    Cardiac  Studies:  Assessment/Plan:  Probable small non-Q-wave myocardial infarction secondary to paroxysmal SVT due to demand ischemia rule out coronary artery disease  Morbid obesity  Tobacco abuse Plan Discussed with patient at length regarding various options of treatment i.e. medical versus invasive left cath possible PTCA stenting its risk and benefits i.e. death MI stroke need for emergency CABG risk of restenosis local vascular complications etc. and consented for PCI  LOS: 1 day    Pj Zehner N 02/23/2013, 6:28 PM

## 2013-02-23 NOTE — ED Notes (Signed)
Food tray has been delivered.

## 2013-02-23 NOTE — ED Notes (Signed)
Pt returned from radiology.

## 2013-02-23 NOTE — Progress Notes (Signed)
  Echocardiogram 2D Echocardiogram has been performed.  Jesse Hurst 02/23/2013, 2:56 PM

## 2013-02-23 NOTE — ED Notes (Signed)
Phlebotomy at bedside.

## 2013-02-24 ENCOUNTER — Encounter (HOSPITAL_COMMUNITY)
Admission: EM | Disposition: A | Discharge status: Home / Self Care | Payer: Self-pay | Source: Home / Self Care | Attending: Cardiology

## 2013-02-24 HISTORY — PX: LEFT HEART CATHETERIZATION WITH CORONARY ANGIOGRAM: SHX5451

## 2013-02-24 LAB — CBC
Hemoglobin: 13.6 g/dL (ref 13.0–17.0)
MCHC: 32.9 g/dL (ref 30.0–36.0)
RBC: 4.57 MIL/uL (ref 4.22–5.81)
RDW: 14.1 % (ref 11.5–15.5)

## 2013-02-24 LAB — POCT ACTIVATED CLOTTING TIME: Activated Clotting Time: 127 seconds

## 2013-02-24 LAB — TROPONIN I: Troponin I: 0.45 ng/mL (ref <0.30)

## 2013-02-24 SURGERY — LEFT HEART CATHETERIZATION WITH CORONARY ANGIOGRAM
Anesthesia: LOCAL

## 2013-02-24 MED ORDER — NITROGLYCERIN 0.2 MG/ML ON CALL CATH LAB
INTRAVENOUS | Status: AC
  Filled 2013-02-24: qty 1

## 2013-02-24 MED ORDER — METOPROLOL TARTRATE 12.5 MG HALF TABLET: 12.5000 mg | ORAL_TABLET | Freq: Two times a day (BID) | ORAL | Status: DC

## 2013-02-24 MED ORDER — FENTANYL CITRATE 0.05 MG/ML IJ SOLN
INTRAMUSCULAR | Status: AC
  Filled 2013-02-24: qty 2

## 2013-02-24 MED ORDER — SODIUM CHLORIDE 0.9 % IV SOLN: INTRAVENOUS | Status: AC

## 2013-02-24 MED ORDER — PANTOPRAZOLE SODIUM 40 MG PO TBEC: 40.0000 mg | DELAYED_RELEASE_TABLET | Freq: Every day | ORAL | Status: DC

## 2013-02-24 MED ORDER — NITROGLYCERIN 0.4 MG SL SUBL: 0.4000 mg | SUBLINGUAL_TABLET | SUBLINGUAL | Status: DC | PRN

## 2013-02-24 MED ORDER — LIDOCAINE HCL (PF) 1 % IJ SOLN
INTRAMUSCULAR | Status: AC
  Filled 2013-02-24: qty 30

## 2013-02-24 MED ORDER — HEPARIN (PORCINE) IN NACL 100-0.45 UNIT/ML-% IJ SOLN: 2450.0000 [IU]/h | INTRAMUSCULAR | Status: DC

## 2013-02-24 MED ORDER — ACETAMINOPHEN 325 MG PO TABS: 650.0000 mg | ORAL_TABLET | ORAL | Status: DC | PRN

## 2013-02-24 MED ORDER — ONDANSETRON HCL 4 MG/2ML IJ SOLN: 4.0000 mg | Freq: Four times a day (QID) | INTRAMUSCULAR | Status: DC | PRN

## 2013-02-24 MED ORDER — DEXTROSE 5 % IV SOLN
3.0000 g | INTRAVENOUS | Status: DC
  Administered 2013-02-24: 3 g via INTRAVENOUS
  Filled 2013-02-24: qty 3000

## 2013-02-24 MED ORDER — HEPARIN (PORCINE) IN NACL 2-0.9 UNIT/ML-% IJ SOLN
INTRAMUSCULAR | Status: AC
  Filled 2013-02-24: qty 1000

## 2013-02-24 MED ORDER — ASPIRIN 81 MG PO TBEC: 81.0000 mg | DELAYED_RELEASE_TABLET | Freq: Every day | ORAL | Status: DC

## 2013-02-24 MED ORDER — MIDAZOLAM HCL 2 MG/2ML IJ SOLN
INTRAMUSCULAR | Status: AC
  Filled 2013-02-24: qty 2

## 2013-02-24 MED ORDER — CEFAZOLIN SODIUM-DEXTROSE 2-3 GM-% IV SOLR
2.0000 g | INTRAVENOUS | Status: DC
  Filled 2013-02-24: qty 50

## 2013-02-24 MED ORDER — ATORVASTATIN CALCIUM 20 MG PO TABS: 20.0000 mg | ORAL_TABLET | Freq: Every day | ORAL | Status: DC

## 2013-02-24 NOTE — CV Procedure (Signed)
Laboratory Per dictated on 02/24/2013 dictation number is [phone]

## 2013-02-24 NOTE — Progress Notes (Signed)
ANTICOAGULATION CONSULT NOTE - Follow Up Consult  Pharmacy Consult:  Heparin Indication: chest pain/ACS  No Known Allergies  Patient Measurements: Height: 6\' 3"  (190.5 cm) Weight: 376 lb 12.3 oz (170.9 kg) IBW/kg (Calculated) : 84.5 Heparin Dosing Weight: 127 kg  Vital Signs: Temp: 97 F (36.1 C) (12/11 0800) Temp src: Oral (12/11 0338) BP: 118/77 mmHg (12/11 0800) Pulse Rate: 62 (12/10 2337)  Labs:  Recent Labs  02/23/13 0005 02/23/13 0013  02/23/13 0517  02/23/13 1100 02/23/13 1345 02/23/13 1931 02/24/13 0310 02/24/13 0950  HGB 14.7 16.0  --  13.0  --  13.7  --   --  13.6  --   HCT 44.1 47.0  --  39.8  --  41.4  --   --  41.4  --   PLT 305  --   --  276  --  264  --   --  255  --   APTT  --   --   --  45*  --   --   --   --   --   --   LABPROT  --   --   --  14.4  --   --  14.4  --   --   --   INR  --   --   --  1.14  --   --  1.14  --   --   --   HEPARINUNFRC  --   --   --   --   < >  --   --  0.19* 0.43 0.30  CREATININE  --  1.50*  --  1.13  --  0.98  --   --   --   --   TROPONINI  --   --   < >  --   --   --  1.41* 0.96* 0.45*  --   < > = values in this interval not displayed.  Estimated Creatinine Clearance: 148.5 ml/min (by C-G formula based on Cr of 0.98).      Assessment: 10 YOM admitted with palpitations associated with SOB and diaphoresis.  He continues on IV heparin for ACS and heparin level remains therapeutic.  No bleeding reported.   Goal of Therapy:  Heparin level 0.3-0.7 units/ml Monitor platelets by anticoagulation protocol: Yes    Plan:  - Increase heparin gtt to 2450 units/hr - Daily HL / CBC    Asucena Galer D. Laney Potash, PharmD, BCPS Pager:  [phone] 02/24/2013, 10:32 AM

## 2013-02-24 NOTE — Progress Notes (Signed)
ANTICOAGULATION CONSULT NOTE - Follow Up Consult  Pharmacy Consult for heparin Indication: chest pain/ACS  Labs:  Recent Labs  02/23/13 0005 02/23/13 0013 02/23/13 0023 02/23/13 0517 02/23/13 0810 02/23/13 1100 02/23/13 1345 02/23/13 1931 02/24/13 0310  HGB 14.7 16.0  --  13.0  --  13.7  --   --  13.6  HCT 44.1 47.0  --  39.8  --  41.4  --   --  41.4  PLT 305  --   --  276  --  264  --   --  255  APTT  --   --   --  45*  --   --   --   --   --   LABPROT  --   --   --  14.4  --   --  14.4  --   --   INR  --   --   --  1.14  --   --  1.14  --   --   HEPARINUNFRC  --   --   --   --  <0.10*  --   --  0.19* 0.43  CREATININE  --  1.50*  --  1.13  --  0.98  --   --   --   TROPONINI  --   --  1.67*  --   --   --  1.41* 0.96*  --     Assessment/Plan:  52yo male now therapeutic on heparin after rate increases.  Will continue gtt at current rate and confirm stable with additional level.  Vernard Gambles, PharmD, BCPS  02/24/2013,3:55 AM

## 2013-02-24 NOTE — Progress Notes (Signed)
Pt refused to shave. Stated he would like to do it later. Will continue to monitor.

## 2013-02-24 NOTE — Interval H&P Note (Signed)
Cath Lab Visit (complete for each Cath Lab visit)  Clinical Evaluation Leading to the Procedure:   ACS: yes  Non-ACS:    Anginal Classification: CCS III  Anti-ischemic medical therapy: Minimal Therapy (1 class of medications)  Non-Invasive Test Results: No non-invasive testing performed  Prior CABG: No previous CABG      History and Physical Interval Note:  02/24/2013 10:53 AM  [name]  has presented today for surgery, with the diagnosis of Chest pain  The various methods of treatment have been discussed with the patient and family. After consideration of risks, benefits and other options for treatment, the patient has consented to  Procedure(s): LEFT HEART CATHETERIZATION WITH CORONARY ANGIOGRAM (N/A) as a surgical intervention .  The patient's history has been reviewed, patient examined, no change in status, stable for surgery.  I have reviewed the patient's chart and labs.  Questions were answered to the patient's satisfaction.     Robynn Pane

## 2013-02-24 NOTE — H&P (View-Only) (Signed)
Subjective:  Patient denies any chest pain states breathing has improved. Patient was noted to have mildly elevated d-dimer subsequently had CT of the chest which was negative for pulmonary embolism. Cardiac enzymes are trending down. No further episodes of SVT on the monitor. 2-D echo showed mildly depressed LV systolic function regional wall motion abnormalities could not be assessed adequately due to poor windows and marked obesity.  Objective:  Vital Signs in the last 24 hours: Temp:  [97.6 F (36.4 C)-98.3 F (36.8 C)] 97.6 F (36.4 C) (12/10 1654) Pulse Rate:  [66-91] 73 (12/10 1400) Resp:  [12-27] 25 (12/10 1400) BP: (89-131)/(41-82) 112/59 mmHg (12/10 1654) SpO2:  [94 %-98 %] 96 % (12/10 1654) Weight:  [170.9 kg (376 lb 12.3 oz)-191.4 kg (421 lb 15.4 oz)] 170.9 kg (376 lb 12.3 oz) (12/10 1235)  Intake/Output from previous day:   Intake/Output from this shift: Total I/O In: 40 [I.V.:40] Out: -   Physical Exam: Neck: no adenopathy, no carotid bruit, no JVD and supple, symmetrical, trachea midline Lungs: clear to auscultation bilaterally Heart: regular rate and rhythm, S1, S2 normal and Soft systolic murmur noted Abdomen: soft, non-tender; bowel sounds normal; no masses,  no organomegaly Extremities: extremities normal, atraumatic, no cyanosis or edema  Lab Results:  Recent Labs  02/23/13 0517 02/23/13 1100  WBC 12.1* 10.9*  HGB 13.0 13.7  PLT 276 264    Recent Labs  02/23/13 0517 02/23/13 1100  NA 138 138  K 4.2 3.9  CL 107 103  CO2 25 26  GLUCOSE 108* 119*  BUN 16 14  CREATININE 1.13 0.98    Recent Labs  02/23/13 0023 02/23/13 1345  TROPONINI 1.67* 1.41*   Hepatic Function Panel  Recent Labs  02/23/13 1100  PROT 7.0  ALBUMIN 3.3*  AST 20  ALT 18  ALKPHOS 71  BILITOT 0.5    Recent Labs  02/23/13 0517  CHOL 131   No results found for this basename: PROTIME,  in the last 72 hours  Imaging: Imaging results have been reviewed and Dg  Chest 2 View  02/23/2013   CLINICAL DATA:  Shortness of breath, chest pain.  EXAM: CHEST  2 VIEW  COMPARISON:  None available for comparison at time of study interpretation.  FINDINGS: Cardiomediastinal silhouette is unremarkable. The lungs are clear without pleural effusions or focal consolidations. Pulmonary vasculature is unremarkable. Trachea projects midline and there is no pneumothorax. Soft tissue planes and included osseous structures are nonsuspicious. Large body habitus.  IMPRESSION: No active cardiopulmonary disease.   Electronically Signed   By: Courtnay  Bloomer   On: 02/23/2013 00:58   Ct Angio Chest W/cm &/or Wo Cm  02/23/2013   CLINICAL DATA:  Palpitations, shortness of breath, dizziness  EXAM: CT ANGIOGRAPHY CHEST WITH CONTRAST  TECHNIQUE: Multidetector CT imaging of the chest was performed using the standard protocol during bolus administration of intravenous contrast. Multiplanar CT image reconstructions including MIPs were obtained to evaluate the vascular anatomy.  CONTRAST:  100mL OMNIPAQUE IOHEXOL 350 MG/ML SOLN  COMPARISON:  None.  FINDINGS: Limited exam because of patient body habitus and distal pulmonary arterial opacification. No significant large central or proximal hilar pulmonary embolus identified by CTA. Normal heart size. No adenopathy. No pericardial or pleural effusion.  Lung windows demonstrate tiny right apical subpleural bleb measuring 2 cm, image 11. Minor bibasilar atelectasis. No focal pneumonia, collapse or consolidation. No edema or interstitial process.  Degenerative changes noted of the thoracic spine with large osteophytes on the right.   Included upper abdomen demonstrates no acute finding.  Review of the MIP images confirms the above findings.  IMPRESSION: No significant acute central or proximal hilar pulmonary embolus.  Basilar atelectasis  No acute intra thoracic finding   Electronically Signed   By: Trevor  Shick M.D.   On: 02/23/2013 09:35    Cardiac  Studies:  Assessment/Plan:  Probable small non-Q-wave myocardial infarction secondary to paroxysmal SVT due to demand ischemia rule out coronary artery disease  Morbid obesity  Tobacco abuse Plan Discussed with patient at length regarding various options of treatment i.e. medical versus invasive left cath possible PTCA stenting its risk and benefits i.e. death MI stroke need for emergency CABG risk of restenosis local vascular complications etc. and consented for PCI  LOS: 1 day    Donevan Biller N 02/23/2013, 6:28 PM    

## 2013-02-25 NOTE — Discharge Summary (Signed)
Jesse Hurst, Jesse Hurst                 ACCOUNT NO.:  [id number]  MEDICAL RECORD NO.:  [id number]  LOCATION:  3W36C                        FACILITY:  [facility]  PHYSICIAN:  Brylin Stanislawski N. Sharyn Lull, M.D. DATE OF BIRTH:  1960/09/05  DATE OF ADMISSION:  02/23/2013 DATE OF DISCHARGE:  02/24/2013                              DISCHARGE SUMMARY   ADMITTING DIAGNOSES: 1. Probable small non-Q-wave myocardial infarction secondary to     paroxysmal supraventricular tachycardia due to demand ischemia     versus rule out coronary artery disease. 2. Morbid obesity. 3. Tobacco abuse.  DISCHARGE DIAGNOSES: 1. Status post probable small non-Q-wave myocardial infarction status     post left cardiac cath with diffuse distal left anterior descending     coronary artery stenosis. 2. Status post paroxysmal supraventricular tachycardia. 3. Glucose intolerance. 4. Morbid obesity. 5. Tobacco abuse. 6. Hypercholesteremia.  DISCHARGE HOME MEDICATIONS:  Aspirin 81 mg 1 tablet daily, metoprolol tartrate 12.5 mg twice daily, Nitrostat sublingual use as directed, Protonix 40 mg daily, Lipitor 20 mg daily.  DIET:  Low-salt, low-cholesterol, 1800 calories ADA diet.  The patient has been advised to avoid carbohydrates.  Post cardiac cath instructions have been given.  Follow up with me in 1 week.  CONDITION AT DISCHARGE:  Stable.  BRIEF HISTORY AND HOSPITAL COURSE:  The patient is 52 year old male with no significant past medical history except for tobacco abuse and morbid obesity.  He came to the ER by EMS complaining of dizziness, shortness of breath, and palpitation, off and on since yesterday afternoon, called EMS and was noted to be in SVT with a heart rate in 200s and spontaneously converted to normal sinus rhythm.  The patient denies any chest pain, nausea, vomiting.  Denies any history of palpitation or shortness of breath in the past.  He denies any PND, orthopnea, or leg swelling.  Denies any prolonged  immobilization.  EKG done in the ER showed no acute ischemic changes.  The patient was noted to have mildly elevated troponin-I of 1.69.  The patient denies any cardiac workup in the past.  PHYSICAL EXAMINATION:  GENERAL:  He was alert, awake, oriented x3. VITAL SIGNS:  Blood pressure was 107/71, pulse was 84.  He was afebrile. EYES:  Conjunctivae was pink. NECK:  Supple.  No JVD.  No bruit. LUNGS:  Clear to auscultation without rhonchi or rales. CARDIOVASCULAR:  S1, S2 was normal.  There was no murmur. ABDOMEN:  Soft.  Bowel sounds were present, obese, nontender. EXTREMITIES:  There is no clubbing, cyanosis, or edema.  His significant labs were sodium 141, potassium 4.3, BUN 17, creatinine 1.50.  Repeat electrolytes, sodium 138, potassium 3.9, BUN 14, creatinine 0.98.  His troponin I was 0.66, second set was 1.67,  next set 1.41, next set 0.96, today was 0.45 which is trending down.  His total cholesterol was normal 131, triglycerides 98, HDL was low at 25, LDL was 86.  Hemoglobin was 14.7, hematocrit 44.1, white count of 13.6. His glucose was 128.  Repeat fasting sugar was 108.  The patient had elevated D-dimer of 2.13, subsequently underwent CT of the chest which was negative for pulmonary embolism.  The patient also  had 2D echo done, which showed LV was mildly dilated with EF of 45-50%.  Regional wall motion abnormalities cannot be excluded because of poor windows and morbid obesity.  BRIEF HOSPITAL COURSE:  The patient was admitted to step-down unit.  The patient ruled in for small non-Q-wave myocardial infarction with atypical presentation.  Discussed with patient at length regarding various options of treatment, and agreed for cardiac cath.  The patient underwent left cardiac cath with selective left and right coronary angiography today as per procedure report.  The patient had diffuse distal LAD stenosis which ends prior to reaching to apex.  The patient did not have any  episodes of chest pain during the hospital stay.  His groin was stable with no evidence of hematoma or bruit.  The patient will be discharged home on above medications and will be followed up in my office in 1 week.     Eduardo Osier. Sharyn Lull, M.D.     [facility]/MEDQ  D:  02/24/2013  T:  02/25/2013  Job:  [id number]

## 2013-02-25 NOTE — Cardiovascular Report (Signed)
[name], Hurst                 ACCOUNT NO.:  [id number]  MEDICAL RECORD NO.:  [id number]  LOCATION:  3W36C                        FACILITY:  [facility]  PHYSICIAN:  Roza Creamer N. Sharyn Hurst, M.D. DATE OF BIRTH:  Aug 08, 1960  DATE OF PROCEDURE:  02/24/2013 DATE OF DISCHARGE:  02/24/2013                           CARDIAC CATHETERIZATION   PROCEDURE:  Left cardiac cath with selective left and right coronary angiography, LV graphy via right groin using Judkins technique.  INDICATION FOR THE PROCEDURE:  Mr. Jesse Hurst is a 52 year old male with no significant past medical history except for tobacco abuse and morbid obesity.  He came to the ER by EMS complaining of dizziness, shortness of breath, palpitation off and on since yesterday afternoon, called EMS and was noted to be in supraventricular tachycardia with heart rate in 200s and spontaneously converted to sinus rhythm on its own.  The patient denies any chest pain, nausea, vomiting, diaphoresis.  Denies any history of palpitation or shortness of breath in the past.  He denies any PND, orthopnea, or leg swelling.  Denies prolonged immobilization.  EKG done in the ER showed normal sinus rhythm with no acute ischemic changes, but the patient was noted to have mildly elevated troponin-I of 1.67.  First set of troponin-I was 0.66.  Repeat troponin was 1.67, next set was 1.41.  The patient also was noted to have elevated D-dimers subsequently underwent spiral CT of the chest which was negative for pulmonary embolism.  A 2D echo showed mildly depressed LV systolic function.  EF approximately 45%-50% with questionable segmental wall motion abnormalities due to sudden onset of shortness of breath, exertional dyspnea, and diaphoresis, elevated cardiac enzymes.  Discussed with patient various options of treatment, i.e., medical versus invasive left cath, possible PTCA stenting, its risks and benefits, i.e., death, MI, stroke, need for emergency CABG, local  vascular complications, etc and consented for PCI.  Stress test could not be done because of his morbidly obesity.  PROCEDURE:  After obtaining the informed consent, the patient was brought to the cath lab and was placed on fluoroscopy table.  Right groin was prepped and draped in usual fashion.  Xylocaine 1% was used for local anesthesia in the right groin.  With the help of thin wall needle, 6-French arterial sheath was placed.  The sheath was aspirated and flushed.  Next, 6-French left Judkins catheter was advanced over the wire under fluoroscopic guidance up to the ascending aorta.  Wire was pulled out.  The catheter was aspirated and connected to the Manifold. Catheter was further advanced and engaged into left coronary ostium. Multiple views of the left system were taken.  Next, catheter was disengaged and was pulled out over the wire and was replaced with 6- [place] right Judkins catheter, which was advanced over the wire under fluoroscopic guidance up to the ascending aorta.  Wire was pulled out. The catheter was aspirated and connected to the Manifold.  Catheter was further advanced and engaged into right coronary ostium.  Multiple views of the right system were taken.  Next, catheter was disengaged and was pulled out over the wire and was replaced with 6-French pigtail catheter which was advanced over the wire  under fluoroscopic guidance up to the ascending aorta.  Wire was pulled out.  The catheter was aspirated and connected to the Manifold.  Catheter was further advanced across the aortic valve into the LV.  LV pressures were recorded.  LV graft was done in 30-degree RAO position.  Post-angiographic pressures were recorded from LV and then pullback pressures were recorded from aorta.  There was no gradient across the aortic valve. Next, the pigtail catheter was pulled out over the wire.  Sheaths were aspirated and flushed.  Arteriotomy was closed using Perclose without any  complications.  FINDINGS:  LV showed good LV systolic function.  EF of approximately 50- 55%.  Left main was patent.  LAD was patent in proximal and mid portion and then was diffusely diseased distally and at the apex, LAD, and before reaching the apex.  Diagonal 1 and 2 were very small which were patent.  Ramus was very small, diffusely diseased.  Left circumflex was patent proximally and then tapers down in AV groove.  OM1 is very large which is patent.  OM2 is very small which is patent.  RCA is large which is patent.  PDA and PLV branches were patent.  The patient tolerated procedure well.  There were no complications.  The patient was transferred to recovery room in stable condition.     Jesse Hurst, M.D.     [facility]/MEDQ  D:  02/24/2013  T:  02/25/2013  Job:  [id number]

## 2013-03-23 ENCOUNTER — Encounter: Payer: Self-pay | Admitting: Family Medicine

## 2013-03-23 ENCOUNTER — Ambulatory Visit (INDEPENDENT_AMBULATORY_CARE_PROVIDER_SITE_OTHER): Payer: BC Managed Care – PPO | Admitting: Family Medicine

## 2013-03-23 DIAGNOSIS — F17200 Nicotine dependence, unspecified, uncomplicated: Secondary | ICD-10-CM

## 2013-03-23 DIAGNOSIS — Z23 Encounter for immunization: Secondary | ICD-10-CM

## 2013-03-23 DIAGNOSIS — I200 Unstable angina: Secondary | ICD-10-CM

## 2013-03-23 DIAGNOSIS — I249 Acute ischemic heart disease, unspecified: Secondary | ICD-10-CM

## 2013-03-23 NOTE — Progress Notes (Signed)
Office Note 07/21/2013  CC:  Chief Complaint  Patient presents with  . Annual Exam    HPI:  Jesse Hurst is a 53 y.o. White male who is here for CPE but says he doesn't want/need one b/c of recent "poking and prodding" done in hosp 02/2013 when he had episode of PSVT and ACS (Dr. Sharyn LullHarwani).  Reviewed hosp d/c summary today---I had not known about this until today. This made him quit smoking and he wants to start diet and exercise to lose wt. Agreeable to nutritionist referral today. Not exercising any at this time.  He does not want to discuss colon or prostate cancer screening today.  Does not want CPE today or labs.  Past Medical History  Diagnosis Date  . Obesity, morbid 12/21/2012  . Tobacco dependence in remission 12/21/2012  . History of PSVT (paroxysmal supraventricular tachycardia)   . Acute coronary syndrome 02/22/2013    Small MI vs demand ischemia in the setting of PSVT  Hx of ACS  Past Surgical History  Procedure Laterality Date  . Cardiac catheterization  02/2013    Diffuse distal LAD dz, no intervention  . Transthoracic echocardiogram  02/2013    LVH, EF 45-50 %    Family History  Problem Relation Age of Onset  . Cancer Mother     Lung (heavy smoker)  d. 4046  . Cancer Father     Lung cancer (heavy smoker)  d 2682  . Drug abuse Son     History   Social History  . Marital Status: Married    Spouse Name: N/A    Number of Children: N/A  . Years of Education: N/A   Occupational History  . Not on file.   Social History Main Topics  . Smoking status: Former Smoker -- 1.00 packs/day for 30 years    Types: Cigarettes    Quit date: 02/22/2013  . Smokeless tobacco: Never Used  . Alcohol Use: No  . Drug Use: No  . Sexual Activity: Not on file   Other Topics Concern  . Not on file   Social History Narrative   Married, 4 children.  3 living (son died of drug overdose).   Occupation: plumber--owns plumbing business.  [place]  [employer].   Education: GED.   Hobbies: Fish and golf.   +Cigarettes: 45 pack-yr hx [place] lights--quit 02/2013 after PSVT/ACS   Alcohol: none   Drugs: none    Outpatient Prescriptions Prior to Visit  Medication Sig Dispense Refill  . aspirin EC 81 MG EC tablet Take 1 tablet (81 mg total) by mouth daily.  30 tablet  3  . atorvastatin (LIPITOR) 20 MG tablet Take 1 tablet (20 mg total) by mouth daily at 6 PM.  30 tablet  3  . metoprolol tartrate (LOPRESSOR) 12.5 mg TABS tablet Take 0.5 tablets (12.5 mg total) by mouth 2 (two) times daily.  30 tablet  3  . nitroGLYCERIN (NITROSTAT) 0.4 MG SL tablet Place 1 tablet (0.4 mg total) under the tongue every 5 (five) minutes x 3 doses as needed for chest pain.  25 tablet  12  . pantoprazole (PROTONIX) 40 MG tablet Take 1 tablet (40 mg total) by mouth daily at 6 (six) AM.  30 tablet  3   No facility-administered medications prior to visit.    No Known Allergies  PE; Blood pressure 127/75, pulse 80, temperature 98.6 F (37 C), temperature source Temporal, resp. rate 20, height 6\' 1"  (1.854 m),  weight 422 lb (191.418 kg), SpO2 96.00%. Gen: Alert, well appearing, morbidly obese-appearing.  Patient is oriented to person, place, time, and situation. AFFECT: pleasant, lucid thought and speech. CV: RRR, no m/r/g.   LUNGS: CTA bilat, nonlabored resps, good aeration in all lung fields. ABD: rotund, ND/NT EXT: no clubbing, cyanosis, or edema.   Pertinent labs:  None today  ASSESSMENT AND PLAN:   Tobacco dependence He has recently quit smoking and I congratulated him on this.   Acute coronary syndrome MI vs demand ischemia in setting of PSVT. Continue metoprolol, statin, ASA. Keep f/u with cardiology. Encouraged smoking cessation and aggressive TLC/Wt loss.   Obesity, morbid He has been given written and verbal info on getting started with TLC/weight loss efforts. It is up to him to get started.  An After Visit Summary was printed and given  to the patient.  Spent 25 min with pt today, with >50% of this time spent in counseling and care coordination regarding the above problems.  FOLLOW UP:  Return in about 4 months (around 07/21/2013) for f/u wt, hx of PSVT & ACS.

## 2013-03-23 NOTE — Progress Notes (Signed)
Pre visit review using our clinic review tool, if applicable. No additional management support is needed unless otherwise documented below in the visit note. 

## 2013-03-23 NOTE — Progress Notes (Signed)
Pre-visit discussion using our clinic review tool. No additional management support is needed unless otherwise documented below in the visit note.  

## 2013-05-16 ENCOUNTER — Ambulatory Visit: Payer: BC Managed Care – PPO | Admitting: Family Medicine

## 2013-07-21 ENCOUNTER — Ambulatory Visit: Payer: BC Managed Care – PPO | Admitting: Family Medicine

## 2013-07-21 NOTE — Assessment & Plan Note (Addendum)
He has recently quit smoking and I congratulated him on this.

## 2013-07-21 NOTE — Assessment & Plan Note (Signed)
MI vs demand ischemia in setting of PSVT. Continue metoprolol, statin, ASA. Keep f/u with cardiology. Encouraged smoking cessation and aggressive TLC/Wt loss.

## 2013-07-21 NOTE — Assessment & Plan Note (Signed)
He has been given written and verbal info on getting started with TLC/weight loss efforts. It is up to him to get started.

## 2014-02-23 ENCOUNTER — Encounter (HOSPITAL_COMMUNITY): Payer: Self-pay | Admitting: Cardiology

## 2015-03-18 DIAGNOSIS — G4733 Obstructive sleep apnea (adult) (pediatric): Secondary | ICD-10-CM

## 2015-03-18 HISTORY — DX: Obstructive sleep apnea (adult) (pediatric): G47.33

## 2015-10-16 DIAGNOSIS — Z8679 Personal history of other diseases of the circulatory system: Secondary | ICD-10-CM

## 2015-10-16 DIAGNOSIS — F17200 Nicotine dependence, unspecified, uncomplicated: Secondary | ICD-10-CM

## 2015-10-16 HISTORY — DX: Nicotine dependence, unspecified, uncomplicated: F17.200

## 2015-10-16 HISTORY — DX: Personal history of other diseases of the circulatory system: Z86.79

## 2015-10-22 ENCOUNTER — Inpatient Hospital Stay (HOSPITAL_COMMUNITY)
Admission: EM | Admit: 2015-10-22 | Discharge: 2015-10-28 | DRG: 291 | Discharge status: Against Medical Advice | Payer: BLUE CROSS/BLUE SHIELD | Attending: Cardiovascular Disease | Admitting: Cardiovascular Disease

## 2015-10-22 ENCOUNTER — Inpatient Hospital Stay (HOSPITAL_COMMUNITY): Payer: BLUE CROSS/BLUE SHIELD

## 2015-10-22 ENCOUNTER — Encounter (HOSPITAL_COMMUNITY): Payer: Self-pay | Admitting: Emergency Medicine

## 2015-10-22 ENCOUNTER — Emergency Department (HOSPITAL_COMMUNITY): Payer: BLUE CROSS/BLUE SHIELD

## 2015-10-22 DIAGNOSIS — J9601 Acute respiratory failure with hypoxia: Secondary | ICD-10-CM | POA: Diagnosis present

## 2015-10-22 DIAGNOSIS — I110 Hypertensive heart disease with heart failure: Secondary | ICD-10-CM | POA: Diagnosis present

## 2015-10-22 DIAGNOSIS — I10 Essential (primary) hypertension: Secondary | ICD-10-CM

## 2015-10-22 DIAGNOSIS — I4892 Unspecified atrial flutter: Secondary | ICD-10-CM | POA: Diagnosis present

## 2015-10-22 DIAGNOSIS — E873 Alkalosis: Secondary | ICD-10-CM | POA: Diagnosis not present

## 2015-10-22 DIAGNOSIS — I5043 Acute on chronic combined systolic (congestive) and diastolic (congestive) heart failure: Secondary | ICD-10-CM | POA: Diagnosis present

## 2015-10-22 DIAGNOSIS — Z6841 Body Mass Index (BMI) 40.0 and over, adult: Secondary | ICD-10-CM | POA: Diagnosis not present

## 2015-10-22 DIAGNOSIS — Z87891 Personal history of nicotine dependence: Secondary | ICD-10-CM

## 2015-10-22 DIAGNOSIS — Z7982 Long term (current) use of aspirin: Secondary | ICD-10-CM

## 2015-10-22 DIAGNOSIS — E119 Type 2 diabetes mellitus without complications: Secondary | ICD-10-CM | POA: Diagnosis present

## 2015-10-22 DIAGNOSIS — Z9119 Patient's noncompliance with other medical treatment and regimen: Secondary | ICD-10-CM | POA: Diagnosis not present

## 2015-10-22 DIAGNOSIS — R0602 Shortness of breath: Secondary | ICD-10-CM | POA: Diagnosis present

## 2015-10-22 DIAGNOSIS — I2510 Atherosclerotic heart disease of native coronary artery without angina pectoris: Secondary | ICD-10-CM | POA: Diagnosis present

## 2015-10-22 DIAGNOSIS — E785 Hyperlipidemia, unspecified: Secondary | ICD-10-CM | POA: Diagnosis present

## 2015-10-22 DIAGNOSIS — M79609 Pain in unspecified limb: Secondary | ICD-10-CM | POA: Diagnosis not present

## 2015-10-22 DIAGNOSIS — I509 Heart failure, unspecified: Secondary | ICD-10-CM | POA: Diagnosis not present

## 2015-10-22 DIAGNOSIS — I471 Supraventricular tachycardia: Secondary | ICD-10-CM | POA: Diagnosis not present

## 2015-10-22 DIAGNOSIS — E662 Morbid (severe) obesity with alveolar hypoventilation: Secondary | ICD-10-CM | POA: Diagnosis present

## 2015-10-22 DIAGNOSIS — Z23 Encounter for immunization: Secondary | ICD-10-CM

## 2015-10-22 DIAGNOSIS — I5041 Acute combined systolic (congestive) and diastolic (congestive) heart failure: Secondary | ICD-10-CM | POA: Diagnosis not present

## 2015-10-22 DIAGNOSIS — R000 Tachycardia, unspecified: Secondary | ICD-10-CM

## 2015-10-22 DIAGNOSIS — I5031 Acute diastolic (congestive) heart failure: Secondary | ICD-10-CM | POA: Diagnosis not present

## 2015-10-22 DIAGNOSIS — I5021 Acute systolic (congestive) heart failure: Secondary | ICD-10-CM | POA: Diagnosis not present

## 2015-10-22 DIAGNOSIS — G4733 Obstructive sleep apnea (adult) (pediatric): Secondary | ICD-10-CM | POA: Diagnosis not present

## 2015-10-22 DIAGNOSIS — R609 Edema, unspecified: Secondary | ICD-10-CM | POA: Diagnosis not present

## 2015-10-22 LAB — BASIC METABOLIC PANEL
ANION GAP: 8 (ref 5–15)
BUN: 13 mg/dL (ref 6–20)
CALCIUM: 8.6 mg/dL — AB (ref 8.9–10.3)
CO2: 27 mmol/L (ref 22–32)
Chloride: 105 mmol/L (ref 101–111)
Creatinine, Ser: 1.06 mg/dL (ref 0.61–1.24)
GFR calc Af Amer: >60 mL/min (ref >60)
GLUCOSE: 101 mg/dL — AB (ref 65–99)
Potassium: 4 mmol/L (ref 3.5–5.1)
SODIUM: 140 mmol/L (ref 135–145)

## 2015-10-22 LAB — CBC WITH DIFFERENTIAL/PLATELET
BASOS ABS: 0 10*3/uL (ref 0.0–0.1)
Basophils Relative: 0 %
EOS ABS: 0.4 10*3/uL (ref 0.0–0.7)
Eosinophils Relative: 4 %
HCT: 42.2 % (ref 39.0–52.0)
Hemoglobin: 13.4 g/dL (ref 13.0–17.0)
Lymphocytes Relative: 26 %
Lymphs Abs: 2.9 10*3/uL (ref 0.7–4.0)
MCH: 29.1 pg (ref 26.0–34.0)
MCHC: 31.8 g/dL (ref 30.0–36.0)
MCV: 91.7 fL (ref 78.0–100.0)
Monocytes Absolute: 1.1 10*3/uL — ABNORMAL HIGH (ref 0.1–1.0)
Monocytes Relative: 10 %
NEUTROS PCT: 60 %
Neutro Abs: 6.7 10*3/uL (ref 1.7–7.7)
Platelets: 273 10*3/uL (ref 150–400)
RBC: 4.6 MIL/uL (ref 4.22–5.81)
RDW: 14.3 % (ref 11.5–15.5)
WBC: 11.3 10*3/uL — AB (ref 4.0–10.5)

## 2015-10-22 LAB — BRAIN NATRIURETIC PEPTIDE: B NATRIURETIC PEPTIDE 5: 72.8 pg/mL (ref 0.0–100.0)

## 2015-10-22 LAB — TSH: TSH: 1.462 u[IU]/mL (ref 0.350–4.500)

## 2015-10-22 LAB — TROPONIN I: Troponin I: <0.03 ng/mL (ref <0.03)

## 2015-10-22 MED ORDER — FUROSEMIDE 10 MG/ML IJ SOLN
60.0000 mg | Freq: Once | INTRAMUSCULAR | Status: AC
  Administered 2015-10-22: 60 mg via INTRAVENOUS
  Filled 2015-10-22: qty 8

## 2015-10-22 MED ORDER — ENOXAPARIN SODIUM 40 MG/0.4ML ~~LOC~~ SOLN
40.0000 mg | SUBCUTANEOUS | Status: DC
  Administered 2015-10-22 – 2015-10-23 (×2): 40 mg via SUBCUTANEOUS
  Filled 2015-10-22 (×2): qty 0.4

## 2015-10-22 MED ORDER — SODIUM CHLORIDE 0.9% FLUSH
3.0000 mL | Freq: Two times a day (BID) | INTRAVENOUS | Status: DC
  Administered 2015-10-23 – 2015-10-28 (×8): 3 mL via INTRAVENOUS

## 2015-10-22 MED ORDER — SODIUM CHLORIDE 0.9% FLUSH: 3.0000 mL | INTRAVENOUS | Status: DC | PRN

## 2015-10-22 MED ORDER — ACETAMINOPHEN 325 MG PO TABS: 650.0000 mg | ORAL_TABLET | ORAL | Status: DC | PRN

## 2015-10-22 MED ORDER — FUROSEMIDE 10 MG/ML IJ SOLN
60.0000 mg | Freq: Two times a day (BID) | INTRAMUSCULAR | Status: DC
  Administered 2015-10-23 – 2015-10-28 (×9): 60 mg via INTRAVENOUS
  Filled 2015-10-22 (×10): qty 6

## 2015-10-22 MED ORDER — SODIUM CHLORIDE 0.9 % IV SOLN: INTRAVENOUS | Status: DC

## 2015-10-22 MED ORDER — ASPIRIN EC 81 MG PO TBEC
81.0000 mg | DELAYED_RELEASE_TABLET | Freq: Every day | ORAL | Status: DC
  Administered 2015-10-22 – 2015-10-24 (×3): 81 mg via ORAL
  Filled 2015-10-22 (×3): qty 1

## 2015-10-22 MED ORDER — SODIUM CHLORIDE 0.9 % IV SOLN: 250.0000 mL | INTRAVENOUS | Status: DC | PRN

## 2015-10-22 MED ORDER — ONDANSETRON HCL 4 MG/2ML IJ SOLN
4.0000 mg | Freq: Four times a day (QID) | INTRAMUSCULAR | Status: DC | PRN
Start: 2015-10-22 — End: 2015-10-28

## 2015-10-22 MED ORDER — PNEUMOCOCCAL VAC POLYVALENT 25 MCG/0.5ML IJ INJ
0.5000 mL | INJECTION | Freq: Once | INTRAMUSCULAR | Status: AC
  Administered 2015-10-23: 0.5 mL via INTRAMUSCULAR
  Filled 2015-10-22: qty 0.5

## 2015-10-22 NOTE — Plan of Care (Signed)
RN paged this NP stating pt's body habitus was too large for VQ scanner. Apparently, ED MD ordered a CT chest to r/o PE because of pt's tachycardia. However, after several attempts by IV team, a 20g IV was unable to be inserted. So, order changed to VQ. RN stated pt continues to be tachycardic, but rhythm is ST and pt has no other sx of PE (hypoxia or chest pain). NP spoke to attending (Dr. Vanessa BarbaraZamora) and he agreed that suspicion was low for PE and HR will likely come down with diuresis of pt's fairly severe CHF decompensation.

## 2015-10-22 NOTE — ED Notes (Signed)
Bed: WA02 Expected date:  Expected time:  Means of arrival:  Comments: TRIAGE 6

## 2015-10-22 NOTE — H&P (Addendum)
History and Physical    Jesse Hurst ZOX:[id number] DOB: 02/05/1961 DOA: 10/22/2015  PCP: [name], MD  Patient coming from: Home  Chief Complaint: Shortness of breath  HPI: Jesse Hurst is a 55 y.o. male with medical history significant of congestive heart failure, last transthoracic echocardiogram performed in 2014 that revealed an EF of 45-50% with left ventricle mildly dilated, presented to the emergency department with a three-day history of worsening shortness of breath associated with cough. He reports associated weight gain (approx 20 lbs) bilateral lower extremity edema and abdominal distension. He denied fevers, chills, sputum production, chest pain, nausea, vomiting. He had not been on diuretic therapy prior to this hospitalization.  ED Course: In the emergency room symptoms felt to be secondary to acute decompensated congestive heart failure for which she was given 60 mg of IV Lasix. Chest x-ray showed interstitial edema. EKG did not show acute ischemic changes  Review of Systems: As per HPI otherwise 10 point review of systems negative.    Past Medical History:  Diagnosis Date  . Acute coronary syndrome (HCC) 02/22/2013   Small MI vs demand ischemia in the setting of PSVT  . History of PSVT (paroxysmal supraventricular tachycardia)   . Obesity, morbid (HCC) 12/21/2012  . 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 CARDIAC CATHETERIZATION  02/2013   Diffuse distal LAD dz, no intervention  . LEFT HEART CATHETERIZATION WITH CORONARY ANGIOGRAM N/A 02/24/2013   Procedure: LEFT HEART CATHETERIZATION WITH CORONARY ANGIOGRAM;  Surgeon: Robynn Pane, MD;  Location: [facility]ATH [facility];  Service: Cardiovascular;  Laterality: N/A;  . TRANSTHORACIC ECHOCARDIOGRAM  02/2013   LVH, EF 45-50 %     reports that he quit smoking about 2 years ago. His smoking use included Cigarettes. He has a 30.00 pack-year smoking history. He has  never used smokeless tobacco. He reports that he does not drink alcohol or use drugs.  No Known Allergies  Family History  Problem Relation Age of Onset  . Cancer Mother     Lung (heavy smoker)  d. 48  . Cancer Father     Lung cancer (heavy smoker)  d 62  . Drug abuse Son      Prior to Admission medications   Not on File    Physical Exam: Vitals:   10/22/15 1334  BP: 122/68  Pulse: (!) 147  Resp: (!) 33  Temp: 97.9 F (36.6 C)  TempSrc: Oral  SpO2: 95%  Dictation #1 [id number]  [id number]  Constitutional: Morbidly obese Vitals:   10/22/15 1334  BP: 122/68  Pulse: (!) 147  Resp: (!) 33  Temp: 97.9 F (36.6 C)  TempSrc: Oral  SpO2: 95%   Eyes: PERRL, lids and conjunctivae normal ENMT: Mucous membranes are moist. Posterior pharynx clear of any exudate or lesions.Normal dentition.  Neck: normal, supple, no masses, no thyromegaly Respiratory: Diminished breath sounds bilaterally likely from body habitus, I did not auscultate crackles or rales  Cardiovascular: Regular rate and rhythm, no murmurs / rubs / gallops.  2+ pedal pulses. No carotid bruits. He has 2+ bilateral extremity pitting edema Abdomen: no tenderness, no masses palpated. No hepatosplenomegaly. Bowel sounds positive.  Musculoskeletal: no clubbing / cyanosis. No joint deformity upper and lower extremities. Good ROM, no contractures. Normal muscle tone.  Skin: no rashes, lesions, ulcers. No induration Neurologic: CN 2-12 grossly intact. Sensation intact, DTR normal. Strength 5/5 in all 4.  Psychiatric: Normal judgment and insight. Alert and  oriented x 3. Normal mood.     Labs on Admission: I have personally reviewed following labs and imaging studies  CBC:  Recent Labs Lab 10/22/15 1430  WBC 11.3*  NEUTROABS 6.7  HGB 13.4  HCT 42.2  MCV 91.7  PLT 273   Basic Metabolic Panel:  Recent Labs Lab 10/22/15 1430  NA 140  K 4.0  CL 105  CO2 27  GLUCOSE 101*  BUN 13  CREATININE 1.06    CALCIUM 8.6*   GFR: CrCl cannot be calculated (Unknown ideal weight.). Liver Function Tests: No results for input(s): AST, ALT, ALKPHOS, BILITOT, PROT, ALBUMIN in the last 168 hours. No results for input(s): LIPASE, AMYLASE in the last 168 hours. No results for input(s): AMMONIA in the last 168 hours. Coagulation Profile: No results for input(s): INR, PROTIME in the last 168 hours. Cardiac Enzymes:  Recent Labs Lab 10/22/15 1430  TROPONINI <0.03   BNP (last 3 results) No results for input(s): PROBNP in the last 8760 hours. HbA1C: No results for input(s): HGBA1C in the last 72 hours. CBG: No results for input(s): GLUCAP in the last 168 hours. Lipid Profile: No results for input(s): CHOL, HDL, LDLCALC, TRIG, CHOLHDL, LDLDIRECT in the last 72 hours. Thyroid Function Tests: No results for input(s): TSH, T4TOTAL, FREET4, T3FREE, THYROIDAB in the last 72 hours. Anemia Panel: No results for input(s): VITAMINB12, FOLATE, FERRITIN, TIBC, IRON, RETICCTPCT in the last 72 hours. Urine analysis: No results found for: COLORURINE, APPEARANCEUR, LABSPEC, PHURINE, GLUCOSEU, HGBUR, BILIRUBINUR, KETONESUR, PROTEINUR, UROBILINOGEN, NITRITE, LEUKOCYTESUR Sepsis Labs: !!!!!!!!!!!!!!!!!!!!!!!!!!!!!!!!!!!!!!!!!!!! (procalcitonin:4,lacticidven:4) )No results found for this or any previous visit (from the past 240 hour(s)).   Radiological Exams on Admission: Dg Chest 2 View  Result Date: 10/22/2015 CLINICAL DATA:  55 year old male with shortness of breath. Initial encounter. EXAM: CHEST  2 VIEW COMPARISON:  Chest CTA and chest radiographs 02/23/2013 FINDINGS: Large body habitus. Mild cardiomegaly is stable to slightly increased. Other mediastinal contours remain normal. Visualized tracheal air column is within normal limits. Chronic but increased pulmonary interstitial opacity. No pneumothorax. No pleural effusion. No consolidation. No acute osseous abnormality identified. IMPRESSION: Chronic  but increased pulmonary interstitial opacity and cardiomegaly. Favor pulmonary interstitial edema. Main differential consideration is viral/ atypical respiratory infection. No definite pleural effusion. Electronically Signed   By: Odessa Fleming M.D.   On: 10/22/2015 15:08    EKG: Independently reviewed. No ischemic changes  Assessment/Plan Principal Problem:   Acute CHF (congestive heart failure) (HCC) Active Problems:   Obesity, morbid (HCC)   Dyslipidemia   Essential hypertension   1.  Acute decompensated systolic congestive heart failure. Jesse Hurst is a 55 year old gentleman with a history of systolic congestive heart failure, last transthoracic echocardiogram performed in 2014 that revealed mildly reduced systolic function of 45-50% with mildly dilated left ventricle. He presents with increasing shortness of breath, weight gain, extremity edema. He states not being on diuretic therapy prior to this hospitalization. Will place Jesse Hurst on CHF protocol, he was given 60 mg of IV Lasix in the emergency department, continue Lasix 60 mg IV twice a day with next dose tomorrow morning, monitor in's and out's, daily weights, obtain a transthoracic echocardiogram.   2.  Probable obstructive sleep apnea. He has a history of morbid obesity, family members reporting that he snores loudly at night time and has periods of not breathing. High suspicion for obesity hypoventilation syndrome/obstructive sleep apnea. Will place him on nighttime CPAP   3.  History of dyslipidemia. He had not been on  statin therapy prior to this presentation. We'll check fasting lipid panel for risk stratification  4.  Sinus tachycardia. In the emergency department patient having sinus tachycardia,ED provider ordering CT scan of lungs with IV contrast to assess for pulmonary embolism.  DVT prophylaxis: Lovenox  Code Status: Full Code Family Communication: I spoke to his daughter who was present at bedside Disposition Plan: Admit to  Tele, anticipate will require greater than 2 nights hospitalization Consults called:  Admission status: Inpatient   Jeralyn BennettZAMORA, Jesse Vanoverbeke MD [facility] Pager [phone]  If 7PM-7AM, please contact night-coverage [email or web address] Password [email or web address]  10/22/2015, 3:42 PM

## 2015-10-22 NOTE — ED Provider Notes (Addendum)
[facility]-EMERGENCY DEPT Provider Note   CSN: [id number] Arrival date & time: 10/22/15  1301  First Provider Contact:  None       History   Chief Complaint Chief Complaint  Patient presents with  . Tachycardia    HPI Jesse Hurst is a 55 y.o. male.  55 year old male presents with three-day history of dyspnea on exertion as well as nonproductive cough without fever or chills. Denies any anginal type chest pain. Does note some orthopnea. Has had some lower extremity edema but denies any prior history of CHF. No pleuritic component to his symptoms. No vomiting or diarrhea. Denies any recent history of blood loss. Check an over-the-counter diuretic due to several days of lower extremity edema which did not help the symptoms. No prior history of same. Nothing makes his symptoms better.   The history is provided by the patient.    Past Medical History:  Diagnosis Date  . Acute coronary syndrome (HCC) 02/22/2013   Small MI vs demand ischemia in the setting of PSVT  . History of PSVT (paroxysmal supraventricular tachycardia)   . Obesity, morbid (HCC) 12/21/2012  . 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 Acute coronary syndrome (HCC) 02/23/2013  . Obesity, morbid (HCC) 12/21/2012  . Tobacco dependence 12/21/2012    Past Surgical History:  Procedure Laterality Date  . CARDIAC CATHETERIZATION  02/2013   Diffuse distal LAD dz, no intervention  . LEFT HEART CATHETERIZATION WITH CORONARY ANGIOGRAM N/A 02/24/2013   Procedure: LEFT HEART CATHETERIZATION WITH CORONARY ANGIOGRAM;  Surgeon: Robynn Pane, MD;  Location: [facility] CATH [facility];  Service: Cardiovascular;  Laterality: N/A;  . TRANSTHORACIC ECHOCARDIOGRAM  02/2013   LVH, EF 45-50 %       Home Medications    Prior to Admission medications   Medication Sig Start Date End Date Taking? Authorizing Provider  aspirin EC 81 MG EC tablet Take 1 tablet (81 mg total) by mouth daily.  02/24/13   Rinaldo Cloud, MD  atorvastatin (LIPITOR) 20 MG tablet Take 1 tablet (20 mg total) by mouth daily at 6 PM. 02/24/13   Rinaldo Cloud, MD  metoprolol tartrate (LOPRESSOR) 12.5 mg TABS tablet Take 0.5 tablets (12.5 mg total) by mouth 2 (two) times daily. 02/24/13   Rinaldo Cloud, MD  nitroGLYCERIN (NITROSTAT) 0.4 MG SL tablet Place 1 tablet (0.4 mg total) under the tongue every 5 (five) minutes x 3 doses as needed for chest pain. 02/24/13   Rinaldo Cloud, MD  pantoprazole (PROTONIX) 40 MG tablet Take 1 tablet (40 mg total) by mouth daily at 6 (six) AM. 02/24/13   Rinaldo Cloud, MD    Family History Family History  Problem Relation Age of Onset  . Cancer Mother     Lung (heavy smoker)  d. 79  . Cancer Father     Lung cancer (heavy smoker)  d 36  . Drug abuse Son     Social History Social History  Substance Use Topics  . Smoking status: Former Smoker    Packs/day: 1.00    Years: 30.00    Types: Cigarettes    Quit date: 02/22/2013  . Smokeless tobacco: Never Used  . Alcohol use No     Allergies   Review of patient's allergies indicates no known allergies.   Review of Systems Review of Systems  All other systems reviewed and are negative.    Physical Exam Updated Vital Signs BP 122/68 (BP Location: Left  Arm)   Pulse (!) 147   Temp 97.9 F (36.6 C) (Oral)   Resp (!) 33   SpO2 95%   Physical Exam  Constitutional: He is oriented to person, place, and time. He appears well-developed and well-nourished.  Non-toxic appearance. No distress.  HENT:  Head: Normocephalic and atraumatic.  Eyes: Conjunctivae, EOM and lids are normal. Pupils are equal, round, and reactive to light.  Neck: Normal range of motion. Neck supple. No tracheal deviation present. No thyroid mass present.  Cardiovascular: Normal rate, regular rhythm and normal heart sounds.  Exam reveals no gallop.   No murmur heard. Pulmonary/Chest: Effort normal. No stridor. Tachypnea noted. No respiratory  distress. He has no decreased breath sounds. He has no wheezes. He has rhonchi in the right lower field and the left lower field. He has no rales.  Abdominal: Soft. Normal appearance and bowel sounds are normal. He exhibits no distension. There is no tenderness. There is no rebound and no CVA tenderness.  Musculoskeletal: Normal range of motion. He exhibits no edema or tenderness.  Bilateral lower extremity edema 3+  Neurological: He is alert and oriented to person, place, and time. He has normal strength. No cranial nerve deficit or sensory deficit. GCS eye subscore is 4. GCS verbal subscore is 5. GCS motor subscore is 6.  Skin: Skin is warm and dry. No abrasion and no rash noted.  Psychiatric: He has a normal mood and affect. His speech is normal and behavior is normal.  Nursing note and vitals reviewed.    ED Treatments / Results  Labs (all labs ordered are listed, but only abnormal results are displayed) Labs Reviewed - No data to display  EKG  EKG Interpretation None       Radiology No results found.  Procedures Procedures (including critical care time)  Medications Ordered in ED Medications - No data to display   Initial Impression / Assessment and Plan / ED Course  I have reviewed the triage vital signs and the nursing notes.  Pertinent labs & imaging results that were available during my care of the patient were reviewed by me and considered in my medical decision making (see chart for details).  Clinical Course  Value Comment By Time  EKG 12-Lead (Reviewed) Shaune Pollackameron Isaacs, MD 08/07 1343    EKG Interpretation  Date/Time:  Monday October 22 2015 13:40:24 EDT Ventricular Rate:  148 PR Interval:    QRS Duration: 137 QT Interval:  356 QTC Calculation: 559 R Axis:   -40 Text Interpretation:  Sinus tachycardia Nonspecific IVCD with LAD Nonspecific T abnormalities, inferior leads ST elevation, consider inferior injury Baseline wander in lead(s) V3 faster when  compared to prior Confirmed by Rache Klimaszewski  MD, Dontasia Miranda ([id number]) on 10/22/2015 3:45:32 PM       Patient given Lasix 60 mg IV push. Chest x-ray consistent with heart failure. Patient is tachycardic possibility of pulmonary embolism remains. Chest CT has been ordered and is pending at this time. Discussed with [facility], Dr. Vanessa BarbaraZamora, who admitted the patient and will follow-up on the patient's chest CT  Final Clinical Impressions(s) / ED Diagnoses   Final diagnoses:  None    New Prescriptions New Prescriptions   No medications on file     Lorre NickAnthony Bonniejean Piano, MD 10/22/15 1544    Lorre NickAnthony Jaasia Viglione, MD 10/22/15 1546

## 2015-10-22 NOTE — Progress Notes (Signed)
RT placed patient on CPAP. Patient setting is 5-20 cmH2O. Sterile water was added to water chamber for humidification. RT will continue to monitor. 

## 2015-10-22 NOTE — Progress Notes (Signed)
Updated MD via phone regarding Pt's heart rate sustaining 145. BP stable 126/64. No other acute changes noted in Pt's assessment at this time. Maintain current plan of for Pt.

## 2015-10-22 NOTE — Progress Notes (Signed)
Admitted to the floor. Lower Ext. Bilaterally discolored Red and very dry areas noted to be cracking. Left lower extremity front of leg below knee with three scratch marks and minimal weeping noted to site. Area cleansed

## 2015-10-23 ENCOUNTER — Encounter (HOSPITAL_COMMUNITY): Payer: Self-pay | Admitting: Radiology

## 2015-10-23 ENCOUNTER — Inpatient Hospital Stay (HOSPITAL_COMMUNITY): Payer: BLUE CROSS/BLUE SHIELD

## 2015-10-23 DIAGNOSIS — I509 Heart failure, unspecified: Secondary | ICD-10-CM

## 2015-10-23 DIAGNOSIS — I5021 Acute systolic (congestive) heart failure: Secondary | ICD-10-CM

## 2015-10-23 DIAGNOSIS — M79609 Pain in unspecified limb: Secondary | ICD-10-CM

## 2015-10-23 DIAGNOSIS — R609 Edema, unspecified: Secondary | ICD-10-CM

## 2015-10-23 LAB — CBC
HCT: 43.1 % (ref 39.0–52.0)
Hemoglobin: 13.2 g/dL (ref 13.0–17.0)
MCH: 28.8 pg (ref 26.0–34.0)
MCHC: 30.6 g/dL (ref 30.0–36.0)
MCV: 93.9 fL (ref 78.0–100.0)
PLATELETS: 263 10*3/uL (ref 150–400)
RBC: 4.59 MIL/uL (ref 4.22–5.81)
RDW: 14.5 % (ref 11.5–15.5)
WBC: 10.1 10*3/uL (ref 4.0–10.5)

## 2015-10-23 LAB — URINALYSIS, ROUTINE W REFLEX MICROSCOPIC
BILIRUBIN URINE: NEGATIVE
Glucose, UA: NEGATIVE mg/dL
HGB URINE DIPSTICK: NEGATIVE
KETONES UR: NEGATIVE mg/dL
Leukocytes, UA: NEGATIVE
NITRITE: NEGATIVE
PROTEIN: NEGATIVE mg/dL
SPECIFIC GRAVITY, URINE: 1.013 (ref 1.005–1.030)
pH: 5.5 (ref 5.0–8.0)

## 2015-10-23 LAB — LIPID PANEL
CHOL/HDL RATIO: 5.1 ratio
Cholesterol: 123 mg/dL (ref 0–200)
HDL: 24 mg/dL — AB (ref >40)
LDL CALC: 81 mg/dL (ref 0–99)
Triglycerides: 92 mg/dL (ref <150)
VLDL: 18 mg/dL (ref 0–40)

## 2015-10-23 LAB — BASIC METABOLIC PANEL
Anion gap: 8 (ref 5–15)
BUN: 13 mg/dL (ref 6–20)
CALCIUM: 8.8 mg/dL — AB (ref 8.9–10.3)
CO2: 30 mmol/L (ref 22–32)
CREATININE: 1.21 mg/dL (ref 0.61–1.24)
Chloride: 101 mmol/L (ref 101–111)
GFR calc non Af Amer: >60 mL/min (ref >60)
Glucose, Bld: 150 mg/dL — ABNORMAL HIGH (ref 65–99)
Potassium: 4.3 mmol/L (ref 3.5–5.1)
Sodium: 139 mmol/L (ref 135–145)

## 2015-10-23 LAB — ECHOCARDIOGRAM COMPLETE
HEIGHTINCHES: 75 in
WEIGHTICAEL: 7629.68 [oz_av]

## 2015-10-23 LAB — D-DIMER, QUANTITATIVE (NOT AT ARMC): D DIMER QUANT: 1.15 ug{FEU}/mL — AB (ref 0.00–0.50)

## 2015-10-23 MED ORDER — PERFLUTREN LIPID MICROSPHERE
1.0000 mL | INTRAVENOUS | Status: AC | PRN
  Administered 2015-10-23: 3 mL via INTRAVENOUS
  Filled 2015-10-23: qty 10

## 2015-10-23 MED ORDER — IOPAMIDOL (ISOVUE-370) INJECTION 76%
100.0000 mL | Freq: Once | INTRAVENOUS | Status: AC | PRN
  Administered 2015-10-23: 100 mL via INTRAVENOUS

## 2015-10-23 MED ORDER — PERFLUTREN LIPID MICROSPHERE
INTRAVENOUS | Status: AC
  Filled 2015-10-23: qty 10

## 2015-10-23 NOTE — Progress Notes (Addendum)
PROGRESS NOTE    Jesse Hurst  JYN:[id number] DOB: Oct 07, 1960 DOA: 10/22/2015  PCP:  No PCP- has not seen is prior PCP in > 2 yrs   Brief Narrative:  55 y/o with h/o systolic CHF and an MI in the past who stopped taking his medications about 6 months after his MI, morbid obesity who presents for 2 wks of dyspnea, pedal edema and shortness of breath. He states he has been steadily gaining weight over the past 6 months. He has not seen a physician in nearly 2 yrs. 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 He is aware that he may have sleep apnea but has not thought about getting a sleep study. No recent chest pain or cough.   Subjective: Feels much better today. Can take a deep breath. No chest pain. Legs still swollen. Family states he snores, stops breathing and may have narcolepsy.   Assessment & Plan:   Principal Problem:   Acute CHF (congestive heart failure) - systolic with acute respiratory failure - obtain ECHO- continue to diurese- he is improving - cont O2 - hold off on starting a B blocker in setting of acute CHF - can start ACE I after he is adequately diuresed  Active Problems: Tachycardia - PE may be adding to his hypoxia - no obvious reason for this - D dimer is elevated-  - IV could not be obtained for a CT yesterday - he is too large for a VQ - obtain venous duplex- RN to re-try IV today    Obesity, morbid - understands that he needs to lose weight  CAD  With diffuse LAD disease on cath in 2014- not on medications - cont ASA for now  Tobacco abuse - discussed stopping - states he does not need a nicotine patch    Dyslipidemia -HDL low-  not on statin   OSA - CPAP when sleeping- discussed with him that he needs a sleep study   DVT prophylaxis: Lovenox Code Status: Full code Family Communication:  Disposition Plan: home in 1-2 days based upon work up Consultants:   none Procedures:    none Antimicrobials:  Anti-infectives    None       Objective: Vitals:   10/23/15 0500 10/23/15 0634 10/23/15 0900 10/23/15 1336  BP:  108/66  108/79  Pulse:  (!) 151  (!) 149  Resp:  18  18  Temp:  97.9 F (36.6 C)  97.9 F (36.6 C)  TempSrc:  Oral  Oral  SpO2:  94%  96%  Weight: (!) 221.6 kg (488 lb 9.6 oz)  (!) 216.3 kg (476 lb 13.7 oz)   Height:        Intake/Output Summary (Last 24 hours) at 10/23/15 1355 Last data filed at 10/23/15 1300  Gross per 24 hour  Intake              720 ml  Output             1300 ml  Net             -580 ml   Filed Weights   10/22/15 1655 10/23/15 0500 10/23/15 0900  Weight: (!) 219.6 kg (484 lb 2.1 oz) (!) 221.6 kg (488 lb 9.6 oz) (!) 216.3 kg (476 lb 13.7 oz)    Examination: General exam: Appears comfortable  HEENT: PERRLA, oral mucosa moist, no sclera icterus or thrush Respiratory system: difficult to auscultate  deu to his size- mild crackles at bases. Respiratory effort normal. Cardiovascular system: S1 & S2 heard, RRR.  No murmurs  Gastrointestinal system: Abdomen soft, non-tender, nondistended. Normal bowel sound. No organomegaly Central nervous system: Alert and oriented. No focal neurological deficits. Extremities: No cyanosis, clubbing -++ pitting edema Skin: No rashes or ulcers Psychiatry:  Mood & affect appropriate.     Data Reviewed: I have personally reviewed following labs and imaging studies  CBC:  Recent Labs Lab 10/22/15 1430 10/23/15 0353  WBC 11.3* 10.1  NEUTROABS 6.7  --   HGB 13.4 13.2  HCT 42.2 43.1  MCV 91.7 93.9  PLT 273 263   Basic Metabolic Panel:  Recent Labs Lab 10/22/15 1430 10/23/15 0353  NA 140 139  K 4.0 4.3  CL 105 101  CO2 27 30  GLUCOSE 101* 150*  BUN 13 13  CREATININE 1.06 1.21  CALCIUM 8.6* 8.8*   GFR: Estimated Creatinine Clearance: 135.4 mL/min (by C-G formula based on SCr of 1.21 mg/dL). Liver Function Tests: No results for input(s): AST, ALT, ALKPHOS,  BILITOT, PROT, ALBUMIN in the last 168 hours. No results for input(s): LIPASE, AMYLASE in the last 168 hours. No results for input(s): AMMONIA in the last 168 hours. Coagulation Profile: No results for input(s): INR, PROTIME in the last 168 hours. Cardiac Enzymes:  Recent Labs Lab 10/22/15 1430  TROPONINI <0.03   BNP (last 3 results) No results for input(s): PROBNP in the last 8760 hours. HbA1C: No results for input(s): HGBA1C in the last 72 hours. CBG: No results for input(s): GLUCAP in the last 168 hours. Lipid Profile:  Recent Labs  10/23/15 0353  CHOL 123  HDL 24*  LDLCALC 81  TRIG 92  CHOLHDL 5.1   Thyroid Function Tests:  Recent Labs  10/22/15 1919  TSH 1.462   Anemia Panel: No results for input(s): VITAMINB12, FOLATE, FERRITIN, TIBC, IRON, RETICCTPCT in the last 72 hours. Urine analysis:    Component Value Date/Time   COLORURINE YELLOW 10/23/2015 0925   APPEARANCEUR CLEAR 10/23/2015 0925   LABSPEC 1.013 10/23/2015 0925   PHURINE 5.5 10/23/2015 0925   GLUCOSEU NEGATIVE 10/23/2015 0925   HGBUR NEGATIVE 10/23/2015 0925   BILIRUBINUR NEGATIVE 10/23/2015 0925   KETONESUR NEGATIVE 10/23/2015 0925   PROTEINUR NEGATIVE 10/23/2015 0925   NITRITE NEGATIVE 10/23/2015 0925   LEUKOCYTESUR NEGATIVE 10/23/2015 0925   Sepsis Labs: @LABRCNTIP (procalcitonin:4,lacticidven:4) )No results found for this or any previous visit (from the past 240 hour(s)).       Radiology Studies: Dg Chest 2 View  Result Date: 10/22/2015 CLINICAL DATA:  55 year old male with shortness of breath. Initial encounter. EXAM: CHEST  2 VIEW COMPARISON:  Chest CTA and chest radiographs 02/23/2013 FINDINGS: Large body habitus. Mild cardiomegaly is stable to slightly increased. Other mediastinal contours remain normal. Visualized tracheal air column is within normal limits. Chronic but increased pulmonary interstitial opacity. No pneumothorax. No pleural effusion. No consolidation. No acute  osseous abnormality identified. IMPRESSION: Chronic but increased pulmonary interstitial opacity and cardiomegaly. Favor pulmonary interstitial edema. Main differential consideration is viral/ atypical respiratory infection. No definite pleural effusion. Electronically Signed   By: Odessa FlemingH  Hall M.D.   On: 10/22/2015 15:08      Scheduled Meds: . aspirin EC  81 mg Oral Daily  . enoxaparin (LOVENOX) injection  40 mg Subcutaneous Q24H  . furosemide  60 mg Intravenous BID  . pneumococcal 23 valent vaccine  0.5 mL Intramuscular Once  . sodium chloride flush  3 mL Intravenous  Q12H   Continuous Infusions:    LOS: 1 day    Time spent in minutes: 35    Bodie Abernethy, MD [facility] Pager: [email or web address] Password [email or web address] 10/23/2015, 1:55 PM

## 2015-10-23 NOTE — Progress Notes (Signed)
Echocardiogram 2D Echocardiogram has been performed.  Dorothey BasemanReel, Kynley Metzger M 10/23/2015, 3:10 PM

## 2015-10-23 NOTE — Care Management Note (Signed)
Case Management Note  Patient Details  Name: Jesse Hurst MRN: [id number] Date of Birth: 06-30-1960  Subjective/Objective: 55 y/o m admitted w/CHF. From home. Has pcp,pharmacy. On 02-may need for home if qualifies, documented in progress note, & ordered. If CPAP for home needed-Sleep study form on shadow chart for MD to complete-CM will fax completed form to Sleep study d/o center for appt to be scheduled.[facility] dme rep Jermaine already aware, & will need-appt set from sleep study d/o center, & CPAP order with settings.                     Action/Plan:d/c plan home.   Expected Discharge Date:   (unknown)               Expected Discharge Plan:  Home/Self Care  In-House Referral:     Discharge planning Services  CM Consult  Post Acute Care Choice:    Choice offered to:     DME Arranged:    DME Agency:     HH Arranged:    HH Agency:     Status of Service:  In process, will continue to follow  If discussed at Long Length of Stay Meetings, dates discussed:    Additional Comments:  Lanier ClamMahabir, Beau Vanduzer, RN 10/23/2015, 3:33 PM

## 2015-10-23 NOTE — Progress Notes (Signed)
  VASCULAR LAB PRELIMINARY  PRELIMINARY  PRELIMINARY  PRELIMINARY  Bilateral lower extremity venous duplex has been completed.     Bilateral:  No evidence of DVT, superficial thrombosis, or Baker's Cyst.  Exam was very difficult due to depth of vessel and the patient habitus. Peroneal veins not visualized in left leg and small segment of the right leg.  Jenetta Logesami Mariah Gerstenberger, RVT, RDMS 10/23/2015, 4:19 PM

## 2015-10-24 LAB — BASIC METABOLIC PANEL
ANION GAP: 8 (ref 5–15)
BUN: 14 mg/dL (ref 6–20)
CO2: 33 mmol/L — AB (ref 22–32)
Calcium: 8.5 mg/dL — ABNORMAL LOW (ref 8.9–10.3)
Chloride: 96 mmol/L — ABNORMAL LOW (ref 101–111)
Creatinine, Ser: 1.03 mg/dL (ref 0.61–1.24)
GFR calc Af Amer: >60 mL/min (ref >60)
GFR calc non Af Amer: >60 mL/min (ref >60)
GLUCOSE: 144 mg/dL — AB (ref 65–99)
POTASSIUM: 3.8 mmol/L (ref 3.5–5.1)
Sodium: 137 mmol/L (ref 135–145)

## 2015-10-24 MED ORDER — DILTIAZEM HCL-DEXTROSE 100-5 MG/100ML-% IV SOLN (PREMIX): 15.0000 mg/h | Freq: Once | INTRAVENOUS | Status: DC

## 2015-10-24 MED ORDER — DILTIAZEM LOAD VIA INFUSION
15.0000 mg | Freq: Once | INTRAVENOUS | Status: DC
  Filled 2015-10-24: qty 15

## 2015-10-24 MED ORDER — METOPROLOL TARTRATE 5 MG/5ML IV SOLN
5.0000 mg | Freq: Once | INTRAVENOUS | Status: AC
  Administered 2015-10-24: 5 mg via INTRAVENOUS
  Filled 2015-10-24: qty 5

## 2015-10-24 MED ORDER — METOPROLOL TARTRATE 5 MG/5ML IV SOLN
INTRAVENOUS | Status: AC
  Administered 2015-10-24: 5 mg via INTRAVENOUS
  Filled 2015-10-24: qty 5

## 2015-10-24 MED ORDER — AMIODARONE HCL IN DEXTROSE 360-4.14 MG/200ML-% IV SOLN
30.0000 mg/h | INTRAVENOUS | Status: DC
  Filled 2015-10-24 (×2): qty 200

## 2015-10-24 MED ORDER — DILTIAZEM HCL-DEXTROSE 100-5 MG/100ML-% IV SOLN (PREMIX): 5.0000 mg/h | INTRAVENOUS | Status: DC

## 2015-10-24 MED ORDER — DILTIAZEM LOAD VIA INFUSION
15.0000 mg | Freq: Once | INTRAVENOUS | Status: AC
  Administered 2015-10-24: 15 mg via INTRAVENOUS
  Filled 2015-10-24: qty 15

## 2015-10-24 MED ORDER — DIGOXIN 0.25 MG/ML IJ SOLN
0.5000 mg | Freq: Once | INTRAMUSCULAR | Status: AC
  Administered 2015-10-24: 0.5 mg via INTRAVENOUS
  Filled 2015-10-24: qty 2

## 2015-10-24 MED ORDER — METOPROLOL TARTRATE 5 MG/5ML IV SOLN
INTRAVENOUS | Status: AC
  Administered 2015-10-24: 5 mg
  Filled 2015-10-24: qty 5

## 2015-10-24 MED ORDER — VERAPAMIL HCL 2.5 MG/ML IV SOLN
5.0000 mg | Freq: Once | INTRAVENOUS | Status: AC
  Administered 2015-10-24: 5 mg via INTRAVENOUS
  Filled 2015-10-24: qty 2

## 2015-10-24 MED ORDER — ENOXAPARIN SODIUM 150 MG/ML ~~LOC~~ SOLN
215.0000 mg | Freq: Two times a day (BID) | SUBCUTANEOUS | Status: DC
  Administered 2015-10-24: 215 mg via SUBCUTANEOUS
  Filled 2015-10-24 (×2): qty 1.43

## 2015-10-24 MED ORDER — DIGOXIN 0.25 MG/ML IJ SOLN
0.2500 mg | Freq: Once | INTRAMUSCULAR | Status: AC
  Administered 2015-10-24: 0.25 mg via INTRAVENOUS
  Filled 2015-10-24 (×2): qty 1

## 2015-10-24 MED ORDER — DILTIAZEM LOAD VIA INFUSION
20.0000 mg | Freq: Once | INTRAVENOUS | Status: AC
Start: 2015-10-24 — End: 2015-10-24
  Administered 2015-10-24: 20 mg via INTRAVENOUS
  Filled 2015-10-24: qty 20

## 2015-10-24 MED ORDER — APIXABAN 5 MG PO TABS
5.0000 mg | ORAL_TABLET | Freq: Two times a day (BID) | ORAL | Status: DC
  Administered 2015-10-24 – 2015-10-28 (×8): 5 mg via ORAL
  Filled 2015-10-24 (×8): qty 1

## 2015-10-24 MED ORDER — METOPROLOL TARTRATE 5 MG/5ML IV SOLN
INTRAVENOUS | Status: AC
  Filled 2015-10-24: qty 5

## 2015-10-24 MED ORDER — DILTIAZEM HCL-DEXTROSE 100-5 MG/100ML-% IV SOLN (PREMIX)
5.0000 mg/h | INTRAVENOUS | Status: DC
  Administered 2015-10-24: 10 mg/h via INTRAVENOUS
  Administered 2015-10-24: 5 mg/h via INTRAVENOUS
  Administered 2015-10-24 – 2015-10-26 (×4): 10 mg/h via INTRAVENOUS
  Filled 2015-10-24 (×6): qty 100

## 2015-10-24 MED ORDER — METOPROLOL TARTRATE 5 MG/5ML IV SOLN
5.0000 mg | Freq: Once | INTRAVENOUS | Status: AC
  Administered 2015-10-24: 5 mg via INTRAVENOUS

## 2015-10-24 MED ORDER — AMIODARONE LOAD VIA INFUSION
150.0000 mg | Freq: Once | INTRAVENOUS | Status: AC
  Administered 2015-10-24: 150 mg via INTRAVENOUS
  Filled 2015-10-24: qty 83.34

## 2015-10-24 MED ORDER — DIGOXIN 0.25 MG/ML IJ SOLN
0.2500 mg | Freq: Once | INTRAMUSCULAR | Status: AC
  Administered 2015-10-24: 0.25 mg via INTRAVENOUS
  Filled 2015-10-24: qty 1

## 2015-10-24 MED ORDER — METOPROLOL TARTRATE 5 MG/5ML IV SOLN
5.0000 mg | Freq: Once | INTRAVENOUS | Status: AC
Start: 2015-10-24 — End: 2015-10-24
  Administered 2015-10-24: 5 mg via INTRAVENOUS

## 2015-10-24 MED ORDER — AMIODARONE HCL IN DEXTROSE 360-4.14 MG/200ML-% IV SOLN
60.0000 mg/h | INTRAVENOUS | Status: DC
  Administered 2015-10-24 (×2): 60 mg/h via INTRAVENOUS
  Filled 2015-10-24: qty 200

## 2015-10-24 NOTE — Consult Note (Signed)
Ref: Jeoffrey MassedMCGOWEN,PHILIP H, MD   Subjective:  Admitted for acute diastolic heart failure and atrial flutter with rapid ventricular response. Patient off cardiac medications x 6 months with steady weight gain. Afebrile.  Objective:  Vital Signs in the last 24 hours: Temp:  [98.1 F (36.7 C)-99.2 F (37.3 C)] 98.3 F (36.8 C) (08/09 2047) Pulse Rate:  [140-146] 141 (08/09 2047) Cardiac Rhythm: Atrial flutter (08/09 0716) Resp:  [18-28] 28 (08/09 2047) BP: (90-131)/(34-87) 95/40 (08/09 2047) SpO2:  [91 %-96 %] 91 % (08/09 2047) Weight:  [217.3 kg (479 lb)] 217.3 kg (479 lb) (08/09 1300)  Physical Exam: BP Readings from Last 1 Encounters:  10/24/15 (!) 95/40    Wt Readings from Last 1 Encounters:  10/24/15 (!) 217.3 kg (479 lb)    Weight change: -3.3 kg (-7 lb 4.4 oz)  HEENT: Comern­o/AT, Eyes- Conjunctiva-Pink, Sclera-Non-icteric Neck: No JVD, No bruit, Trachea midline. Lungs:  Clearing, Bilateral. Cardiac:  Irregular rhythm, normal S1 and S2, no S3. II/VI systolic murmur. Abdomen:  Soft, non-tender. Extremities:  1 + edema present. No cyanosis. No clubbing. CNS: AxOx3, Cranial nerves grossly intact, moves all 4 extremities. Right handed. Skin: Warm and dry.   Intake/Output from previous day: 08/08 0701 - 08/09 0700 In: 960 [P.O.:960] Out: 2750 [Urine:2750]    Lab Results: BMET    Component Value Date/Time   NA 137 10/24/2015 0536   NA 139 10/23/2015 0353   NA 140 10/22/2015 1430   K 3.8 10/24/2015 0536   K 4.3 10/23/2015 0353   K 4.0 10/22/2015 1430   CL 96 (L) 10/24/2015 0536   CL 101 10/23/2015 0353   CL 105 10/22/2015 1430   CO2 33 (H) 10/24/2015 0536   CO2 30 10/23/2015 0353   CO2 27 10/22/2015 1430   GLUCOSE 144 (H) 10/24/2015 0536   GLUCOSE 150 (H) 10/23/2015 0353   GLUCOSE 101 (H) 10/22/2015 1430   BUN 14 10/24/2015 0536   BUN 13 10/23/2015 0353   BUN 13 10/22/2015 1430   CREATININE 1.03 10/24/2015 0536   CREATININE 1.21 10/23/2015 0353   CREATININE 1.06  10/22/2015 1430   CALCIUM 8.5 (L) 10/24/2015 0536   CALCIUM 8.8 (L) 10/23/2015 0353   CALCIUM 8.6 (L) 10/22/2015 1430   GFRNONAA >60 10/24/2015 0536   GFRNONAA >60 10/23/2015 0353   GFRNONAA >60 10/22/2015 1430   GFRAA >60 10/24/2015 0536   GFRAA >60 10/23/2015 0353   GFRAA >60 10/22/2015 1430   CBC    Component Value Date/Time   WBC 10.1 10/23/2015 0353   RBC 4.59 10/23/2015 0353   HGB 13.2 10/23/2015 0353   HCT 43.1 10/23/2015 0353   PLT 263 10/23/2015 0353   MCV 93.9 10/23/2015 0353   MCH 28.8 10/23/2015 0353   MCHC 30.6 10/23/2015 0353   RDW 14.5 10/23/2015 0353   LYMPHSABS 2.9 10/22/2015 1430   MONOABS 1.1 (H) 10/22/2015 1430   EOSABS 0.4 10/22/2015 1430   BASOSABS 0.0 10/22/2015 1430   HEPATIC Function Panel No results for input(s): PROT in the last 8760 hours.  Invalid input(s):  ALBUMIN,  AST,  ALT,  ALKPHOS,  BILIDIR,  IBILI HEMOGLOBIN A1C No components found for: HGA1C,  MPG CARDIAC ENZYMES Lab Results  Component Value Date   TROPONINI <0.03 10/22/2015   TROPONINI 0.45 (HH) 02/24/2013   TROPONINI 0.96 (HH) 02/23/2013   BNP No results for input(s): PROBNP in the last 8760 hours. TSH  Recent Labs  10/22/15 1919  TSH 1.462   CHOLESTEROL  Recent Labs  10/23/15 0353  CHOL 123    Scheduled Meds: . apixaban  5 mg Oral BID  . aspirin EC  81 mg Oral Daily  . furosemide  60 mg Intravenous BID  . sodium chloride flush  3 mL Intravenous Q12H   Continuous Infusions: . diltiazem (CARDIZEM) infusion 10 mg/hr (10/24/15 1636)   PRN Meds:.sodium chloride, acetaminophen, ondansetron (ZOFRAN) IV, sodium chloride flush  Assessment/Plan: Atrial flutter with RVR, CHA2DS2VASc score iof 2 Acute diastolic heart failure. DM, II Morbid obesity Tobacco use disorder Obstructive sleep apnea  Discussed TEE and cardioversion tomorrow. IV metoprolol 5 mg. x one for rate control.   LOS: 2 days    Orpah Cobb  MD  10/24/2015, 10:01 PM

## 2015-10-24 NOTE — Progress Notes (Signed)
Stat EKG showed A.flutter with HR sustaining in 140s.  Patient is asymptomatic.  MD was paged with findings.  Amiodarone gtt and one time bolus was started, BP stable. Will continue to monitor.

## 2015-10-24 NOTE — Progress Notes (Signed)
Per cardiology, patient was not responding to amiodarone gtt.  Cardizem gtt was started with a  bolus at 1239.  BP at this time was 102/84, HR 140. Patient received 2 separate  boluses of cardizem at 1240 and 1422 due to HR sustaining at 140 without any change.    One time orders of cardiac meds per cardiology given to patient.  Metoprolol  at 1140 and again at 1159.  No change in patient's HR. Patient received second bolus of cardizem of  at 1422. Cardiology was consulted again d/t no change in rate cardizem was increased to  and verapamil  was ordered.  Patient's HR remained the same.  Cardiology was consulted again and new orders for digoxin to be given and repeated in 30 minutes.  At this time, patient's HR remains at 140, BP is stable.  Will continue to monitor and consult cardiology as needed.

## 2015-10-24 NOTE — Progress Notes (Signed)
RT called to patient room due to patient wanting to take a nap and requesting to go on CPAP machine.  Patient placed on CPAP with auto titrate mode and is currently tolerating well.

## 2015-10-24 NOTE — Progress Notes (Signed)
Patient still in A-flutter heart rate still in the 140s, Cardizem drip infusing,  Dr. Algie CofferKadakia in the room to see patient,ordered 5mg   IV metoprolol , BP between 80s and 90s, MD informed. Patient in bed sleeping, denies any pain/distress. Will continue to monitor patient.

## 2015-10-24 NOTE — Progress Notes (Signed)
Cardizem was restarted at 1745.  5mg  of Metoprolol was given per MD and 30 minutes later the patient received a 20mg  bolus of Cardizem.  HR remained in the 140s.  Dr. Sharyn LullHarwani was made aware and ordered Digoxin IV x1.  BP remained stable.  Will continue to monitor and pass along update to night RN to follow up.

## 2015-10-24 NOTE — Consult Note (Signed)
Reason for Consult: Atrial flutter with 2 to 1 conduction  Referring Physician:  [facility]   Jesse Hurst is an 55 y.o. male.  Jesse Hurst:[id number] is 55 year old male with past medical history significant for coronary artery disease, noted to have distal diffuse LAD stenosis in 2014 presented with paroxysmal SVT noted to have minimally elevated troponin I, which was attributed to demand ischemia, glucose intolerance, morbid obesity, tobacco abuse, obstructive sleep apnea/obesity hypoventilation syndrome, noncompliant to medication and follow-up was admitted because of progressive increasing shortness of breath associated with leg swelling for last 2 weeks associated with feeling tired, fatigued and weak.  Patient denies any palpitation, lightheadedness or syncope.  He was noted to be in atrial flutter with 2 to one block.  Patient denies any chest pain, nausea, vomiting, diaphoresis.  States stopped all his medications are for one year in 2015.  Patient was started on IV amiodarone.  This morning without significant change in heart rate, received 2 doses of IV Lopressor without any effect.  Patient presently denies any complaints.  Denies any palpitations.  Patient states he has chronic snoring problem at night and daytime sleepiness.  Past Medical History:  Diagnosis Date  . Acute coronary syndrome ([place]) 02/22/2013   Small MI vs demand ischemia in the setting of PSVT  . History of PSVT (paroxysmal supraventricular tachycardia)   . Obesity, morbid ([place]) 12/21/2012  . 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 CARDIAC CATHETERIZATION  02/2013   Diffuse distal LAD dz, no intervention  . LEFT HEART CATHETERIZATION WITH CORONARY ANGIOGRAM N/A 02/24/2013   Procedure: LEFT HEART CATHETERIZATION WITH CORONARY ANGIOGRAM;  Surgeon: Clent Demark, MD;  Location: [facility];  Service: Cardiovascular;  Laterality: N/A;  . TRANSTHORACIC ECHOCARDIOGRAM   02/2013   LVH, EF 45-50 %    Family History  Problem Relation Age of Onset  . Cancer Mother     Lung (heavy smoker)  d. 75  . Cancer Father     Lung cancer (heavy smoker)  d 46  . Drug abuse Son     Social History:  reports that he quit smoking about 2 years ago. His smoking use included Cigarettes. He has a 30.00 pack-year smoking history. He has never used smokeless tobacco. He reports that he does not drink alcohol or use drugs.  Allergies: Not on File  Medications: I have reviewed the patient's current medications.  Results for orders placed or performed during the hospital encounter of 10/22/15 (from the past 48 hour(s))  CBC with Differential/Platelet     Status: Abnormal   Collection Time: 10/22/15  2:30 PM  Result Value Ref Range   WBC 11.3 (H) 4.0 - 10.5 K/uL   RBC 4.60 4.22 - 5.81 MIL/uL   Hemoglobin 13.4 13.0 - 17.0 g/dL   HCT 42.2 39.0 - 52.0 %   MCV 91.7 78.0 - 100.0 fL   MCH 29.1 26.0 - 34.0 pg   MCHC 31.8 30.0 - 36.0 g/dL   RDW 14.3 11.5 - 15.5 %   Platelets 273 150 - 400 K/uL   Neutrophils Relative % 60 %   Neutro Abs 6.7 1.7 - 7.7 K/uL   Lymphocytes Relative 26 %   Lymphs Abs 2.9 0.7 - 4.0 K/uL   Monocytes Relative 10 %   Monocytes Absolute 1.1 (H) 0.1 - 1.0 K/uL   Eosinophils Relative 4 %   Eosinophils Absolute 0.4 0.0 - 0.7 K/uL   Basophils  Relative 0 %   Basophils Absolute 0.0 0.0 - 0.1 K/uL  Brain natriuretic peptide     Status: None   Collection Time: 10/22/15  2:30 PM  Result Value Ref Range   B Natriuretic Peptide 72.8 0.0 - 100.0 pg/mL  Troponin I     Status: None   Collection Time: 10/22/15  2:30 PM  Result Value Ref Range   Troponin I <0.03 <0.03 ng/mL  Basic metabolic panel     Status: Abnormal   Collection Time: 10/22/15  2:30 PM  Result Value Ref Range   Sodium 140 135 - 145 mmol/L   Potassium 4.0 3.5 - 5.1 mmol/L   Chloride 105 101 - 111 mmol/L   CO2 27 22 - 32 mmol/L   Glucose, Bld 101 (H) 65 - 99 mg/dL   BUN 13 6 - 20 mg/dL    Creatinine, Ser 1.06 0.61 - 1.24 mg/dL   Calcium 8.6 (L) 8.9 - 10.3 mg/dL   GFR calc non Af Amer >60 >60 mL/min   GFR calc Af Amer >60 >60 mL/min    Comment: (NOTE) The eGFR has been calculated using the CKD EPI equation. This calculation has not been validated in all clinical situations. eGFR's persistently <60 mL/min signify possible Chronic Kidney Disease.    Anion gap 8 5 - 15  TSH     Status: None   Collection Time: 10/22/15  7:19 PM  Result Value Ref Range   TSH 1.462 0.350 - 4.500 uIU/mL  Basic metabolic panel     Status: Abnormal   Collection Time: 10/23/15  3:53 AM  Result Value Ref Range   Sodium 139 135 - 145 mmol/L   Potassium 4.3 3.5 - 5.1 mmol/L   Chloride 101 101 - 111 mmol/L   CO2 30 22 - 32 mmol/L   Glucose, Bld 150 (H) 65 - 99 mg/dL   BUN 13 6 - 20 mg/dL   Creatinine, Ser 1.21 0.61 - 1.24 mg/dL   Calcium 8.8 (L) 8.9 - 10.3 mg/dL   GFR calc non Af Amer >60 >60 mL/min   GFR calc Af Amer >60 >60 mL/min    Comment: (NOTE) The eGFR has been calculated using the CKD EPI equation. This calculation has not been validated in all clinical situations. eGFR's persistently <60 mL/min signify possible Chronic Kidney Disease.    Anion gap 8 5 - 15  CBC     Status: None   Collection Time: 10/23/15  3:53 AM  Result Value Ref Range   WBC 10.1 4.0 - 10.5 K/uL   RBC 4.59 4.22 - 5.81 MIL/uL   Hemoglobin 13.2 13.0 - 17.0 g/dL   HCT 43.1 39.0 - 52.0 %   MCV 93.9 78.0 - 100.0 fL   MCH 28.8 26.0 - 34.0 pg   MCHC 30.6 30.0 - 36.0 g/dL   RDW 14.5 11.5 - 15.5 %   Platelets 263 150 - 400 K/uL  Lipid panel     Status: Abnormal   Collection Time: 10/23/15  3:53 AM  Result Value Ref Range   Cholesterol 123 0 - 200 mg/dL   Triglycerides 92 <150 mg/dL   HDL 24 (L) >40 mg/dL   Total CHOL/HDL Ratio 5.1 RATIO   VLDL 18 0 - 40 mg/dL   LDL Cholesterol 81 0 - 99 mg/dL    Comment:        Total Cholesterol/HDL:CHD Risk Coronary Heart Disease Risk Table  Men    Women  1/2 Average Risk   3.4   3.3  Average Risk       5.0   4.4  2 X Average Risk   9.6   7.1  3 X Average Risk  23.4   11.0        Use the calculated Patient Ratio above and the CHD Risk Table to determine the patient's CHD Risk.        ATP III CLASSIFICATION (LDL):  <100     mg/dL   Optimal  100-129  mg/dL   Near or Above                    Optimal  130-159  mg/dL   Borderline  160-189  mg/dL   High  >190     mg/dL   Very High Performed at [facility]   Urinalysis, Routine w reflex microscopic (not at [facility])     Status: None   Collection Time: 10/23/15  9:25 AM  Result Value Ref Range   Color, Urine YELLOW YELLOW   APPearance CLEAR CLEAR   Specific Gravity, Urine 1.013 1.005 - 1.030   pH 5.5 5.0 - 8.0   Glucose, UA NEGATIVE NEGATIVE mg/dL   Hgb urine dipstick NEGATIVE NEGATIVE   Bilirubin Urine NEGATIVE NEGATIVE   Ketones, ur NEGATIVE NEGATIVE mg/dL   Protein, ur NEGATIVE NEGATIVE mg/dL   Nitrite NEGATIVE NEGATIVE   Leukocytes, UA NEGATIVE NEGATIVE    Comment: MICROSCOPIC NOT DONE ON URINES WITH NEGATIVE PROTEIN, BLOOD, LEUKOCYTES, NITRITE, OR GLUCOSE <1000 mg/dL.  D-dimer, quantitative (not at [facility])     Status: Abnormal   Collection Time: 10/23/15 12:03 PM  Result Value Ref Range   D-Dimer, Quant 1.15 (H) 0.00 - 0.50 ug/mL-FEU    Comment: (NOTE) At the manufacturer cut-off of 0.50 ug/mL FEU, this assay has been documented to exclude PE with a sensitivity and negative predictive value of 97 to 99%.  At this time, this assay has not been approved by the FDA to exclude DVT/VTE. Results should be correlated with clinical presentation.   Basic metabolic panel     Status: Abnormal   Collection Time: 10/24/15  5:36 AM  Result Value Ref Range   Sodium 137 135 - 145 mmol/L   Potassium 3.8 3.5 - 5.1 mmol/L   Chloride 96 (L) 101 - 111 mmol/L   CO2 33 (H) 22 - 32 mmol/L   Glucose, Bld 144 (H) 65 - 99 mg/dL   BUN 14 6 - 20 mg/dL   Creatinine, Ser 1.03 0.61 - 1.24  mg/dL   Calcium 8.5 (L) 8.9 - 10.3 mg/dL   GFR calc non Af Amer >60 >60 mL/min   GFR calc Af Amer >60 >60 mL/min    Comment: (NOTE) The eGFR has been calculated using the CKD EPI equation. This calculation has not been validated in all clinical situations. eGFR's persistently <60 mL/min signify possible Chronic Kidney Disease.    Anion gap 8 5 - 15    Dg Chest 2 View  Result Date: 10/22/2015 CLINICAL DATA:  55 year old male with shortness of breath. Initial encounter. EXAM: CHEST  2 VIEW COMPARISON:  Chest CTA and chest radiographs 02/23/2013 FINDINGS: Large body habitus. Mild cardiomegaly is stable to slightly increased. Other mediastinal contours remain normal. Visualized tracheal air column is within normal limits. Chronic but increased pulmonary interstitial opacity. No pneumothorax. No pleural effusion. No consolidation. No acute osseous abnormality identified. IMPRESSION: Chronic but increased  pulmonary interstitial opacity and cardiomegaly. Favor pulmonary interstitial edema. Main differential consideration is viral/ atypical respiratory infection. No definite pleural effusion. Electronically Signed   By: Genevie Ann M.D.   On: 10/22/2015 15:08   Ct Angio Chest Pe W Or Wo Contrast  Result Date: 10/23/2015 CLINICAL DATA:  Shortness of breath.  Elevated D-dimer. EXAM: CT ANGIOGRAPHY CHEST WITH CONTRAST TECHNIQUE: Multidetector CT imaging of the chest was performed using the standard protocol during bolus administration of intravenous contrast. Multiplanar CT image reconstructions and MIPs were obtained to evaluate the vascular anatomy. CONTRAST:  100 cc Omnipaque 370 intravenous COMPARISON:  02/23/2013 FINDINGS: Cardiovascular: Normal heart size. No pericardial effusion. Negative thoracic aorta when accounting for calcification at ligamentum arteriosum. Evaluation of the pulmonary arteries is limited by patient size, bolus dispersion, and intermittent motion. This is likely best obtainable study  under the circumstances. No visualized pulmonary embolism. Mediastinum: Stable benign appearance of mediastinal lymph nodes. Lungs/Pleura: Streaky opacities with volume loss in the basilar lungs, greater on the left where there is multi segment lower lobe atelectasis. No edema, consolidation, effusion, or pneumothorax. Diffuse airway thickening. Upper abdomen: No acute findings. Partly seen rounded structure in the left upper quadrant posteriorly was also seen previously. This could be a 25 mm adrenal adenoma. Musculoskeletal: No chest wall mass or suspicious bone lesions identified. Bridging lower thoracic osteophytes Review of the MIP images confirms the above findings. IMPRESSION: 1. No evidence of pulmonary embolism. Evaluation of peripheral vessels is limited by patient factors. 2. Airway thickening and multi segment atelectasis. Electronically Signed   By: Monte Fantasia M.D.   On: 10/23/2015 15:45    Review of Systems  Constitutional: Positive for malaise/fatigue. Negative for chills and fever.  Eyes: Negative for double vision.  Respiratory: Positive for shortness of breath.   Cardiovascular: Positive for leg swelling. Negative for chest pain and palpitations.  Gastrointestinal: Negative for nausea and vomiting.  Genitourinary: Negative for dysuria.  Neurological: Negative for headaches.   Blood pressure (!) 96/46, pulse (!) 144, temperature 98.2 F (36.8 C), temperature source Oral, resp. rate 19, height 6' 3"  (1.905 m), weight (!) 476 lb 13.7 oz (216.3 kg), SpO2 94 %. Physical Exam  Constitutional: He is oriented to person, place, and time.  HENT:  Head: Normocephalic and atraumatic.  Eyes: Conjunctivae are normal. Pupils are equal, round, and reactive to light. Left eye exhibits no discharge. No scleral icterus.  Neck: Normal range of motion. Neck supple. No JVD present. No tracheal deviation present. No thyromegaly present.  Cardiovascular:  Tachycardiac, irregularly irregular, S1,  S2, soft  Respiratory: Effort normal and breath sounds normal. No respiratory distress. He has no wheezes. He has no rales.  GI: Soft. Bowel sounds are normal. He exhibits distension. There is no tenderness. There is no rebound and no guarding.  Musculoskeletal: He exhibits edema (4+). He exhibits no tenderness or deformity.  Neurological: He is alert and oriented to person, place, and time.    Assessment/Plan: Atrial flutter with 2 to one block with chads vasc score of 2 Decompensated congestive heart failure secondary to preserved LV systolic function Mild coronary artery disease with distal diffuse LAD stenosis. New-onset diabetes mellitus. Hyperlipidemia. Morbid obesity. Tobacco abuse. Obstructive sleep apnea/obesity.  Hypoventilation syndrome. Plan Will start Cardizem drip for rate control. Start Eliquis 5 mg twice daily Will DC amiodarone as it appears patient may have atrial flutter for approximately 2 weeks. Will discuss with patient regarding TEE cardioversion if he remains in atrial flutter.  Charolette Forward 10/24/2015, 11:39 AM

## 2015-10-24 NOTE — Progress Notes (Signed)
Paged MD because HR sustaining in 140s.  Cardiologist ordered metoprolol  IV x1. When RN was attempting to admin medication, RN noticed that patient had a small wet area under IV.  Patient stated "I noticed that a little bit ago." RN paged IV team for stat IV restart d/t difficult stick.  Awaiting new IV and will restart cardizem.

## 2015-10-24 NOTE — Progress Notes (Signed)
PROGRESS NOTE    Jesse Hurst  ZOX:[id number] DOB: November 25, 1960 DOA: 10/22/2015  PCP:  No PCP- has not seen is prior PCP in > 2 yrs   Brief Narrative:  55 y/o with h/o systolic CHF and an MI in the past who stopped taking his medications about 6 months after his MI, morbid obesity who presents for 2 wks of dyspnea, pedal edema and shortness of breath. He states he has been steadily gaining weight over the past 6 months. He has not seen a physician in nearly 2 yrs. 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 He is aware that he may have sleep apnea but has not thought about getting a sleep study. No recent chest pain or cough.   Subjective: Feels much better today. No palpitations or chest pain.   Assessment & Plan:   Principal Problem: Acute CHF (congestive heart failure) - systolic with acute respiratory failure - CT negative for PE - ECHO shows hyperdynamic LV - continue to diurese- he is improving - cont O2 - hold off on starting a B blocker in setting of acute CHF - can start ACE I after he is adequately diuresed  Active Problems: A-flutter - Amiodarone IV started as recommended by on call cardiology, Dr Sharyn Lull, he will see the patient later today  Elevated D dimer - CT negative for PE -Venous duplex negative    Obesity, morbid - understands that he needs to lose weight  CAD  - With diffuse LAD disease on cath in 2014- not on medications - cont ASA for now  Tobacco abuse - discussed stopping - states he does not need a nicotine patch    Dyslipidemia -HDL low-  not on statin   OSA - CPAP when sleeping- discussed with him that he needs a sleep study   DVT prophylaxis: Lovenox Code Status: Full code Family Communication:  Disposition Plan: home in 1-2 days based upon work up Consultants:   none Procedures:   none Antimicrobials:  Anti-infectives    None       Objective: Vitals:   10/24/15  1040 10/24/15 1148 10/24/15 1152 10/24/15 1201  BP: (!) 96/46 98/67 (!) 90/34 (!) 109/43  Pulse: (!) 144 (!) 140    Resp: 19     Temp: 98.2 F (36.8 C)     TempSrc: Oral     SpO2: 94%     Weight:      Height:        Intake/Output Summary (Last 24 hours) at 10/24/15 1204 Last data filed at 10/23/15 1852  Gross per 24 hour  Intake              600 ml  Output             1450 ml  Net             -850 ml   Filed Weights   10/22/15 1655 10/23/15 0500 10/23/15 0900  Weight: (!) 219.6 kg (484 lb 2.1 oz) (!) 221.6 kg (488 lb 9.6 oz) (!) 216.3 kg (476 lb 13.7 oz)    Examination: General exam: Appears comfortable  HEENT: PERRLA, oral mucosa moist, no sclera icterus or thrush Respiratory system: difficult to auscultate deu to his size- mild crackles at bases. Respiratory effort normal. Cardiovascular system: S1 & S2 heard, RRR.  No murmurs  Gastrointestinal system: Abdomen soft, non-tender, nondistended. Normal bowel sound. No organomegaly Central nervous system: Alert  and oriented. No focal neurological deficits. Extremities: No cyanosis, clubbing -++ pitting edema Skin: No rashes or ulcers Psychiatry:  Mood & affect appropriate.     Data Reviewed: I have personally reviewed following labs and imaging studies  CBC:  Recent Labs Lab 10/22/15 1430 10/23/15 0353  WBC 11.3* 10.1  NEUTROABS 6.7  --   HGB 13.4 13.2  HCT 42.2 43.1  MCV 91.7 93.9  PLT 273 263   Basic Metabolic Panel:  Recent Labs Lab 10/22/15 1430 10/23/15 0353 10/24/15 0536  NA 140 139 137  K 4.0 4.3 3.8  CL 105 101 96*  CO2 27 30 33*  GLUCOSE 101* 150* 144*  BUN 13 13 14   CREATININE 1.06 1.21 1.03  CALCIUM 8.6* 8.8* 8.5*   GFR: Estimated Creatinine Clearance: 159.1 mL/min (by C-G formula based on SCr of 1.03 mg/dL). Liver Function Tests: No results for input(s): AST, ALT, ALKPHOS, BILITOT, PROT, ALBUMIN in the last 168 hours. No results for input(s): LIPASE, AMYLASE in the last 168 hours. No  results for input(s): AMMONIA in the last 168 hours. Coagulation Profile: No results for input(s): INR, PROTIME in the last 168 hours. Cardiac Enzymes:  Recent Labs Lab 10/22/15 1430  TROPONINI <0.03   BNP (last 3 results) No results for input(s): PROBNP in the last 8760 hours. HbA1C: No results for input(s): HGBA1C in the last 72 hours. CBG: No results for input(s): GLUCAP in the last 168 hours. Lipid Profile:  Recent Labs  10/23/15 0353  CHOL 123  HDL 24*  LDLCALC 81  TRIG 92  CHOLHDL 5.1   Thyroid Function Tests:  Recent Labs  10/22/15 1919  TSH 1.462   Anemia Panel: No results for input(s): VITAMINB12, FOLATE, FERRITIN, TIBC, IRON, RETICCTPCT in the last 72 hours. Urine analysis:    Component Value Date/Time   COLORURINE YELLOW 10/23/2015 0925   APPEARANCEUR CLEAR 10/23/2015 0925   LABSPEC 1.013 10/23/2015 0925   PHURINE 5.5 10/23/2015 0925   GLUCOSEU NEGATIVE 10/23/2015 0925   HGBUR NEGATIVE 10/23/2015 0925   BILIRUBINUR NEGATIVE 10/23/2015 0925   KETONESUR NEGATIVE 10/23/2015 0925   PROTEINUR NEGATIVE 10/23/2015 0925   NITRITE NEGATIVE 10/23/2015 0925   LEUKOCYTESUR NEGATIVE 10/23/2015 0925   Sepsis Labs: @LABRCNTIP (procalcitonin:4,lacticidven:4) )No results found for this or any previous visit (from the past 240 hour(s)).       Radiology Studies: Dg Chest 2 View  Result Date: 10/22/2015 CLINICAL DATA:  55 year old male with shortness of breath. Initial encounter. EXAM: CHEST  2 VIEW COMPARISON:  Chest CTA and chest radiographs 02/23/2013 FINDINGS: Large body habitus. Mild cardiomegaly is stable to slightly increased. Other mediastinal contours remain normal. Visualized tracheal air column is within normal limits. Chronic but increased pulmonary interstitial opacity. No pneumothorax. No pleural effusion. No consolidation. No acute osseous abnormality identified. IMPRESSION: Chronic but increased pulmonary interstitial opacity and cardiomegaly. Favor  pulmonary interstitial edema. Main differential consideration is viral/ atypical respiratory infection. No definite pleural effusion. Electronically Signed   By: Odessa FlemingH  Hall M.D.   On: 10/22/2015 15:08   Ct Angio Chest Pe W Or Wo Contrast  Result Date: 10/23/2015 CLINICAL DATA:  Shortness of breath.  Elevated D-dimer. EXAM: CT ANGIOGRAPHY CHEST WITH CONTRAST TECHNIQUE: Multidetector CT imaging of the chest was performed using the standard protocol during bolus administration of intravenous contrast. Multiplanar CT image reconstructions and MIPs were obtained to evaluate the vascular anatomy. CONTRAST:  100 cc Omnipaque 370 intravenous COMPARISON:  02/23/2013 FINDINGS: Cardiovascular: Normal heart size. No pericardial effusion. Negative  thoracic aorta when accounting for calcification at ligamentum arteriosum. Evaluation of the pulmonary arteries is limited by patient size, bolus dispersion, and intermittent motion. This is likely best obtainable study under the circumstances. No visualized pulmonary embolism. Mediastinum: Stable benign appearance of mediastinal lymph nodes. Lungs/Pleura: Streaky opacities with volume loss in the basilar lungs, greater on the left where there is multi segment lower lobe atelectasis. No edema, consolidation, effusion, or pneumothorax. Diffuse airway thickening. Upper abdomen: No acute findings. Partly seen rounded structure in the left upper quadrant posteriorly was also seen previously. This could be a 25 mm adrenal adenoma. Musculoskeletal: No chest wall mass or suspicious bone lesions identified. Bridging lower thoracic osteophytes Review of the MIP images confirms the above findings. IMPRESSION: 1. No evidence of pulmonary embolism. Evaluation of peripheral vessels is limited by patient factors. 2. Airway thickening and multi segment atelectasis. Electronically Signed   By: Marnee Spring M.D.   On: 10/23/2015 15:45      Scheduled Meds: . aspirin EC  81 mg Oral Daily  .  enoxaparin (LOVENOX) injection  215 mg Subcutaneous Q12H  . furosemide  60 mg Intravenous BID  . sodium chloride flush  3 mL Intravenous Q12H   Continuous Infusions: . amiodarone 60 mg/hr (10/24/15 1156)   Followed by  . amiodarone       LOS: 2 days    Time spent in minutes: 35    Drea Jurewicz, MD [facility] Pager: [email or web address] Password [email or web address] 10/24/2015, 12:04 PM

## 2015-10-24 NOTE — Progress Notes (Addendum)
ANTICOAGULATION CONSULT NOTE - Initial Consult  Pharmacy Consult for lovenox Indication: atrial fibrillation  Not on File  Patient Measurements: Height: 6\' 3"  (190.5 cm) Weight: (!) 476 lb 13.7 oz (216.3 kg) IBW/kg (Calculated) : 84.5 Heparin Dosing Weight:   Vital Signs: Temp: 98.1 F (36.7 C) (08/09 0630) Temp Source: Oral (08/09 0630) BP: 131/71 (08/09 0630) Pulse Rate: 145 (08/09 0630)  Labs:  Recent Labs  10/22/15 1430 10/23/15 0353 10/24/15 0536  HGB 13.4 13.2  --   HCT 42.2 43.1  --   PLT 273 263  --   CREATININE 1.06 1.21 1.03  TROPONINI <0.03  --   --     Estimated Creatinine Clearance: 159.1 mL/min (by C-G formula based on SCr of 1.03 mg/dL).   Medical History: Past Medical History:  Diagnosis Date  . Acute coronary syndrome ([facility]) 02/22/2013   Small MI vs demand ischemia in the setting of PSVT  . History of PSVT (paroxysmal supraventricular tachycardia)   . Obesity, morbid ([facility]) 12/21/2012  . Tobacco dependence in remission 12/21/2012    Assessment: Patient is a 55 y.o obese M with hx CHF presented to the ED on 8/7 with c/o SOB.  Patient's now in afib--to start full dose lovenox on 8/9.   Goal of Therapy:  Anti-Xa level 0.6-1 units/ml 4hrs after LMWH dose given Monitor platelets by anticoagulation protocol: Yes   Plan:  - d/c lovenox 40 mg, start lovenox 215 mg SQ q12h - cbc q72h - monitor for s/s bleeding   Raahi Korber P 10/24/2015,8:14 AM

## 2015-10-25 ENCOUNTER — Inpatient Hospital Stay (HOSPITAL_COMMUNITY): Payer: BLUE CROSS/BLUE SHIELD | Admitting: Certified Registered"

## 2015-10-25 ENCOUNTER — Ambulatory Visit: Payer: Self-pay | Admitting: Family Medicine

## 2015-10-25 ENCOUNTER — Inpatient Hospital Stay (HOSPITAL_COMMUNITY): Payer: BLUE CROSS/BLUE SHIELD

## 2015-10-25 ENCOUNTER — Encounter (HOSPITAL_COMMUNITY): Payer: Self-pay | Admitting: *Deleted

## 2015-10-25 ENCOUNTER — Encounter (HOSPITAL_COMMUNITY)
Admission: EM | Discharge status: Against Medical Advice | Payer: Self-pay | Source: Home / Self Care | Attending: Cardiovascular Disease

## 2015-10-25 DIAGNOSIS — G4733 Obstructive sleep apnea (adult) (pediatric): Secondary | ICD-10-CM

## 2015-10-25 DIAGNOSIS — E662 Morbid (severe) obesity with alveolar hypoventilation: Secondary | ICD-10-CM

## 2015-10-25 DIAGNOSIS — I4892 Unspecified atrial flutter: Secondary | ICD-10-CM

## 2015-10-25 DIAGNOSIS — I5031 Acute diastolic (congestive) heart failure: Secondary | ICD-10-CM

## 2015-10-25 DIAGNOSIS — I5041 Acute combined systolic (congestive) and diastolic (congestive) heart failure: Secondary | ICD-10-CM

## 2015-10-25 HISTORY — PX: TEE WITHOUT CARDIOVERSION: SHX5443

## 2015-10-25 HISTORY — PX: CARDIOVERSION: SHX1299

## 2015-10-25 LAB — BASIC METABOLIC PANEL
Anion gap: 7 (ref 5–15)
BUN: 15 mg/dL (ref 6–20)
CALCIUM: 8.2 mg/dL — AB (ref 8.9–10.3)
CO2: 32 mmol/L (ref 22–32)
CREATININE: 0.92 mg/dL (ref 0.61–1.24)
Chloride: 97 mmol/L — ABNORMAL LOW (ref 101–111)
GFR calc Af Amer: >60 mL/min (ref >60)
GLUCOSE: 133 mg/dL — AB (ref 65–99)
Potassium: 3.5 mmol/L (ref 3.5–5.1)
Sodium: 136 mmol/L (ref 135–145)

## 2015-10-25 LAB — CBC
HCT: 39.2 % (ref 39.0–52.0)
Hemoglobin: 12.3 g/dL — ABNORMAL LOW (ref 13.0–17.0)
MCH: 28.5 pg (ref 26.0–34.0)
MCHC: 31.4 g/dL (ref 30.0–36.0)
MCV: 90.7 fL (ref 78.0–100.0)
PLATELETS: 236 10*3/uL (ref 150–400)
RBC: 4.32 MIL/uL (ref 4.22–5.81)
RDW: 13.9 % (ref 11.5–15.5)
WBC: 10.5 10*3/uL (ref 4.0–10.5)

## 2015-10-25 LAB — MRSA PCR SCREENING: MRSA BY PCR: NEGATIVE

## 2015-10-25 SURGERY — ECHOCARDIOGRAM, TRANSESOPHAGEAL
Anesthesia: General

## 2015-10-25 MED ORDER — SODIUM CHLORIDE 0.9 % IV SOLN: INTRAVENOUS | Status: DC

## 2015-10-25 MED ORDER — LACTATED RINGERS IV SOLN
INTRAVENOUS | Status: DC | PRN
  Administered 2015-10-25: 10:00:00 via INTRAVENOUS

## 2015-10-25 MED ORDER — MIDAZOLAM HCL 2 MG/2ML IJ SOLN
INTRAMUSCULAR | Status: AC
  Filled 2015-10-25: qty 2

## 2015-10-25 MED ORDER — METOPROLOL TARTRATE 5 MG/5ML IV SOLN
INTRAVENOUS | Status: DC | PRN
  Administered 2015-10-25 (×2): 2 mg via INTRAVENOUS
  Administered 2015-10-25: 1 mg via INTRAVENOUS

## 2015-10-25 MED ORDER — SUCCINYLCHOLINE CHLORIDE 20 MG/ML IJ SOLN
INTRAMUSCULAR | Status: DC | PRN
  Administered 2015-10-25: 130 mg via INTRAVENOUS

## 2015-10-25 MED ORDER — PROPOFOL 500 MG/50ML IV EMUL
INTRAVENOUS | Status: DC | PRN
  Administered 2015-10-25: 50 ug/kg/min via INTRAVENOUS

## 2015-10-25 MED ORDER — PROPOFOL 10 MG/ML IV BOLUS
INTRAVENOUS | Status: DC | PRN
  Administered 2015-10-25: 150 mg via INTRAVENOUS
  Administered 2015-10-25: 50 mg via INTRAVENOUS

## 2015-10-25 MED ORDER — BUTAMBEN-TETRACAINE-BENZOCAINE 2-2-14 % EX AERO
INHALATION_SPRAY | CUTANEOUS | Status: DC | PRN
  Administered 2015-10-25: 2 via TOPICAL

## 2015-10-25 NOTE — Progress Notes (Signed)
10/25/2015 RN was called to patient room at 1400. When RN arrive in the room family reported to RN patient was in shower. Patient had  iv line connected and telemetry line connected with box hanging on iv pole. Patient was already told by the nurse earlier he could not take a shower without a order from the MD. Jesse Hurst.

## 2015-10-25 NOTE — Progress Notes (Signed)
10/25/2015 Patient came from the PACU after having a Cardioversion and TEE. He arrive on the unit with a bipap and then was placed on Nasal Cannula. Patient is alert, oriented and ambulatory. He came up on the unit with Cardizem drip. Patient was had a MRSA swab and CHG bath. Patient buttocks is intact and excoriation groin area, bilateral edema in the lower extremities and tattoos on arms. Lovie MacadamiaNadine Landis Cassaro RN

## 2015-10-25 NOTE — Anesthesia Postprocedure Evaluation (Addendum)
Anesthesia Post Note  Patient: Jesse Hurst  Procedure(s) Performed: Procedure(s) (LRB): TRANSESOPHAGEAL ECHOCARDIOGRAM (TEE) (N/A) CARDIOVERSION (N/A)  Patient location during evaluation: PACU Anesthesia Type: General Level of consciousness: awake and alert Pain management: pain level controlled Vital Signs Assessment: post-procedure vital signs reviewed and stable Respiratory status: spontaneous breathing, nonlabored ventilation, respiratory function stable and patient connected to nasal cannula oxygen Cardiovascular status: stable and blood pressure returned to baseline Anesthetic complications: no    Last Vitals:  Vitals:   10/25/15 1249 10/25/15 1318  BP:  (!) 109/52  Pulse: 65 72  Resp: 15 18  Temp: 36.4 C 36.5 C    Last Pain:  Vitals:   10/25/15 1318  TempSrc: Oral  PainSc:                  Shelton SilvasKevin D Hollis

## 2015-10-25 NOTE — CV Procedure (Signed)
INDICATIONS:   The patient is 55 year old male with acute diastolic heart failure has atrial flutter, uncontrolled and not responding well to medications.Marland Kitchen.  PROCEDURE:  Informed consent was discussed including risks, benefits and alternatives for the procedure.  Risks include, but are not limited to, cough, sore throat, vomiting, nausea, somnolence, esophageal and stomach trauma or perforation, bleeding, low blood pressure, aspiration, pneumonia, infection, trauma to the teeth and death.    Patient was given sedation.  The oropharynx was anesthetized with topical lidocaine.  The transesophageal probe was inserted in the esophagus and stomach and multiple views were obtained.  Agitated saline was used after the transesophageal probe was removed from the body.  The patient was kept under observation until the patient left the procedure room.  The patient left the procedure room in stable condition.   COMPLICATIONS:  There were no immediate complications.  FINDINGS:  1. LEFT VENTRICLE: The left ventricle is normal in structure and mild generalized hypokinesia. No thrombus or masses seen in the left ventricle.  2. RIGHT VENTRICLE:  The right ventricle is normal in structure and function without any thrombus or masses.    3. LEFT ATRIUM:  The left atrium is normal without any thrombus or masses.  4. LEFT ATRIAL APPENDAGE:  The left atrial appendage is free of any thrombus or masses.  5. RIGHT ATRIUM:  The right atrium is free of any thrombus or masses.    6. ATRIAL SEPTUM:  The atrial septum is normal without any ASD or PFO. Sonicated saline was used.  7. MITRAL VALVE:  The mitral valve is normal in structure and function with multijet mild to moderate regurgitation, no masses, stenosis or vegetations.  8. TRICUSPID VALVE:  The tricuspid valve is normal in structure and function with mild regurgitation, no masses, stenosis or vegetations.  9. AORTIC VALVE:  The aortic valve is normal in  structure and function without regurgitation, masses, stenosis or vegetations.   10. PULMONIC VALVE:  The pulmonic valve is normal in structure and function without regurgitation, masses, stenosis or vegetations.  11. AORTIC ARCH, ASCENDING AND DESCENDING AORTA:  The aorta had no atherosclerosis in the ascending or descending aorta.  The aortic arch was normal.  IMPRESSION:   1. Mild LV systolic dysfunction. 2. Mild TR and moderate MR. 3. Questionable small clot in right pulmonary trunk or pericardial sac.  RECOMMENDATIONS:    Successful cardioversion with 200 J and deep sedation by anestheia.

## 2015-10-25 NOTE — Anesthesia Preprocedure Evaluation (Addendum)
Anesthesia Evaluation  Patient identified by MRN, date of birth, ID band Patient awake    Reviewed: Allergy & Precautions, NPO status , Patient's Chart, lab work & pertinent test results  Airway Mallampati: III  TM Distance: >3 FB Neck ROM: Full    Dental  (+) Poor Dentition, Dental Advisory Given   Pulmonary sleep apnea (probable) , former smoker,    breath sounds clear to auscultation       Cardiovascular hypertension, + CAD [facility]), + Past MI (02/22/2013) and +CHF (presented with decompensated systolic CHF; EF 16-10%45-50% on TTE 02/2013)  + dysrhythmias Atrial Fibrillation  Rhythm:Irregular Rate:Tachycardia  TTE 02/23/2013: Study Conclusions - Left ventricle: The cavity size was mildly dilated. Systolic function was mildly reduced. The estimated ejection fraction was in the range of 45% to 50%. Regional wall motion abnormalities cannot be excluded. - Aortic valve: Trivial regurgitation. - Atrial septum: No defect or patent foramen ovale was identified.   Neuro/Psych negative neurological ROS  negative psych ROS   GI/Hepatic negative GI ROS, Neg liver ROS,   Endo/Other  negative endocrine ROSMorbid obesity  Renal/GU negative Renal ROS     Musculoskeletal negative musculoskeletal ROS (+)   Abdominal (+) + obese,  Abdomen: soft.    Peds negative pediatric ROS (+)  Hematology   Anesthesia Other Findings HLD  Reproductive/Obstetrics negative OB ROS                           Anesthesia Physical Anesthesia Plan  ASA: III  Anesthesia Plan: MAC   Post-op Pain Management:    Induction: Intravenous  Airway Management Planned: Natural Airway  Additional Equipment:   Intra-op Plan:   Post-operative Plan: Possible Post-op intubation/ventilation  Informed Consent: I have reviewed the patients History and Physical, chart, labs and discussed the  procedure including the risks, benefits and alternatives for the proposed anesthesia with the patient or authorized representative who has indicated his/her understanding and acceptance.   Dental advisory given  Plan Discussed with: CRNA  Anesthesia Plan Comments: (Consented for GA as likelihood of GA secondary to airway obstruction is high. )      Anesthesia Quick Evaluation

## 2015-10-25 NOTE — Transfer of Care (Signed)
Immediate Anesthesia Transfer of Care Note  Patient: Jesse Hurst  Procedure(s) Performed: Procedure(s): TRANSESOPHAGEAL ECHOCARDIOGRAM (TEE) (N/A) CARDIOVERSION (N/A)  Patient Location: PACU  Anesthesia Type:General  Level of Consciousness: awake, alert , oriented and patient cooperative  Airway & Oxygen Therapy: Patient Spontanous Breathing and non-rebreather face mask  Post-op Assessment: Report given to RN and Patient moving all extremities X 4  Post vital signs: Reviewed and stable  Last Vitals:  Vitals:   10/25/15 0620 10/25/15 0945  BP: 117/78 106/68  Pulse: (!) 141 (!) 141  Resp: 20 (!) 25  Temp: 36.6 C     Last Pain:  Vitals:   10/25/15 0945  TempSrc: Oral  PainSc:          Complications: No apparent anesthesia complications

## 2015-10-25 NOTE — Progress Notes (Signed)
Echocardiogram Echocardiogram Transesophageal has been performed.  Marisue Humblelexis N Jamelle Goldston 10/25/2015, 11:31 AM

## 2015-10-25 NOTE — Progress Notes (Signed)
Patient transported on BiPAP to 2C07. Vitals stable throughout trip.

## 2015-10-25 NOTE — Consult Note (Signed)
ELECTROPHYSIOLOGY CONSULT NOTE    Patient ID: Jesse Hurst MRN: [id number], DOB/AGE: [birth year] 55 y.o.  Admit date: 10/22/2015 Date of Consult: 10/25/2015  Primary Physician: [name], MD Primary Cardiologist: Dr. Sharyn Lull Requesting MD: Dr. Sharyn Lull  Reason for Consultation: Aflutter  HPI: Jesse Hurst is a 55 y.o. male with PMHx of CAD by notes diffuse LAD disease in 2014, morbid obesity, tobacco use, OSA/hypoventilatory syndrome, non-compliance notes state that he had been off all his cardiac medicine for about 6 months, sought attention at [facility] with increased SOB, fatigue, and LE swelling, admitted with dx new AFlutter with RVR 2:1 conduction, acute CHF exacerbation and new ?DM.  The pt initially treated with  IV amiodarone without significant change in heart rate, received 2 doses of IV Lopressor without any effect, cardizem gtt and Eliquis, the amio gtt stopped.  Despite this his HR remained elevated and was decided to transfer to [facility] today for TEE/DCCV.  The patient underwent TEE/DCCV successful restoration of SR, required ETT intubation, though was able to be extubated, was admitted to stepdown bed at [facility] for further care and management.  The patient states for a couple weeks he has had progressive exertional intolerance, generalized fatigue and increasing swelling to the point he states fluid was weeping from his LE, and DOE.  He denies any kind of CP, and did not perceive his heart racing.  Though he states he can clearly feel the difference, much better in SR.  Admit LABS: K+ 4.0 BUN/Creat 13/1.06 Trop <0.03 BNP 72 H/H 13/42 WBC 11.3 plts 273 TSH 1.462 Ddimer elevated 1.15  >> neg CT   Past Medical History:  Diagnosis Date  . Acute coronary syndrome (HCC) 02/22/2013   Small MI vs demand ischemia in the setting of PSVT  . History of PSVT (paroxysmal supraventricular tachycardia)   . Obesity, morbid (HCC) 12/21/2012  . 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 CARDIAC CATHETERIZATION  02/2013   Diffuse distal LAD dz, no intervention  . LEFT HEART CATHETERIZATION WITH CORONARY ANGIOGRAM N/A 02/24/2013   Procedure: LEFT HEART CATHETERIZATION WITH CORONARY ANGIOGRAM;  Surgeon: Robynn Pane, MD;  Location: [facility] CATH LAB;  Service: Cardiovascular;  Laterality: N/A;  . TRANSTHORACIC ECHOCARDIOGRAM  02/2013   LVH, EF 45-50 %     No prescriptions prior to admission.    Inpatient Medications:  . [MAR Hold] apixaban  5 mg Oral BID  . [MAR Hold] aspirin EC  81 mg Oral Daily  . [MAR Hold] furosemide  60 mg Intravenous BID  . [MAR Hold] sodium chloride flush  3 mL Intravenous Q12H    Allergies: No Known Allergies  Social History   Social History  . Marital status: Married    Spouse name: N/A  . Number of children: N/A  . Years of education: N/A   Occupational History  . Not on file.   Social History Main Topics  . Smoking status: Former Smoker    Packs/day: 1.00    Years: 30.00    Types: Cigarettes    Quit date: 02/22/2013  . Smokeless tobacco: Never Used  . Alcohol use No  . Drug use: No  . Sexual activity: Not on file   Other Topics Concern  . Not on file   Social History Narrative   Married, 4 children.  3 living (son died of drug overdose).   Occupation: plumber--owns plumbing business.  Lives in [place],  [place].  [employer].   Education: GED.   Hobbies: Fish and golf.   +Cigarettes: 45 pack-yr hx [place] lights--quit 02/2013 after PSVT/ACS   Alcohol: none   Drugs: none     Family History  Problem Relation Age of Onset  . Cancer Mother     Lung (heavy smoker)  d. 10446  . Cancer Father     Lung cancer (heavy smoker)  d 6482  . Drug abuse Son      Review of Systems: All other systems reviewed and are otherwise negative except as noted above.  Physical Exam: Vitals:   10/25/15 0204 10/25/15 0602 10/25/15 0620 10/25/15 0945  BP: 94/63   117/78 106/68  Pulse: (!) 140  (!) 141 (!) 141  Resp: (!) 24  20 (!) 25  Temp: 98.1 F (36.7 C)  97.9 F (36.6 C)   TempSrc: Oral  Oral Oral  SpO2: (!) 89%  (!) 89% 96%  Weight:  (!) 474 lb 12.8 oz (215.4 kg)    Height:        GEN- The patient is well appearing, alert and oriented x 3 today.   HEENT: normocephalic, atraumatic; sclera clear, conjunctiva pink; hearing intact; oropharynx clear; neck supple, no JVP Lymph- no cervical lymphadenopathy Lungs- Clear to ausculation bilaterally, normal work of breathing.  No wheezes, rales, rhonchi Heart- Regular rate and rhythm, no murmurs, rubs or gallops, PMI not laterally displaced GI- soft, non-tender, non-distended Extremities- no clubbing, cyanosis, 3+ edema (reported as much improved) MS- no significant deformity or atrophy Skin- warm and dry, no rash or lesion Psych- euthymic mood, full affect Neuro- no gross deficits observed  Labs:   Lab Results  Component Value Date   WBC 10.5 10/25/2015   HGB 12.3 (L) 10/25/2015   HCT 39.2 10/25/2015   MCV 90.7 10/25/2015   PLT 236 10/25/2015    Recent Labs Lab 10/25/15 0507  NA 136  K 3.5  CL 97*  CO2 32  BUN 15  CREATININE 0.92  CALCIUM 8.2*  GLUCOSE 133*      Radiology/Studies:   Dg Chest 2 View Result Date: 10/22/2015 CLINICAL DATA:  55 year old male with shortness of breath. Initial encounter. EXAM: CHEST  2 VIEW COMPARISON:  Chest CTA and chest radiographs 02/23/2013 FINDINGS: Large body habitus. Mild cardiomegaly is stable to slightly increased. Other mediastinal contours remain normal. Visualized tracheal air column is within normal limits. Chronic but increased pulmonary interstitial opacity. No pneumothorax. No pleural effusion. No consolidation. No acute osseous abnormality identified. IMPRESSION: Chronic but increased pulmonary interstitial opacity and cardiomegaly. Favor pulmonary interstitial edema. Main differential consideration is viral/ atypical respiratory  infection. No definite pleural effusion. Electronically Signed   By: Odessa FlemingH  Hall M.D.   On: 10/22/2015 15:08   Ct Angio Chest Pe W Or Wo Contrast Result Date: 10/23/2015 CLINICAL DATA:  Shortness of breath.  Elevated D-dimer. EXAM: CT ANGIOGRAPHY CHEST WITH CONTRAST TECHNIQUE: Multidetector CT imaging of the chest was performed using the standard protocol during bolus administration of intravenous contrast. Multiplanar CT image reconstructions and MIPs were obtained to evaluate the vascular anatomy. CONTRAST:  100 cc Omnipaque 370 intravenous COMPARISON:  02/23/2013 FINDINGS: Cardiovascular: Normal heart size. No pericardial effusion. Negative thoracic aorta when accounting for calcification at ligamentum arteriosum. Evaluation of the pulmonary arteries is limited by patient size, bolus dispersion, and intermittent motion. This is likely best obtainable study under the circumstances. No visualized pulmonary embolism. Mediastinum: Stable benign appearance of mediastinal lymph nodes. Lungs/Pleura: Streaky opacities with  volume loss in the basilar lungs, greater on the left where there is multi segment lower lobe atelectasis. No edema, consolidation, effusion, or pneumothorax. Diffuse airway thickening. Upper abdomen: No acute findings. Partly seen rounded structure in the left upper quadrant posteriorly was also seen previously. This could be a 25 mm adrenal adenoma. Musculoskeletal: No chest wall mass or suspicious bone lesions identified. Bridging lower thoracic osteophytes Review of the MIP images confirms the above findings. IMPRESSION: 1. No evidence of pulmonary embolism. Evaluation of peripheral vessels is limited by patient factors. 2. Airway thickening and multi segment atelectasis. Electronically Signed   By: Marnee Spring M.D.   On: 10/23/2015 15:45    EKG: AFlutter 2:1, appears typical >>> SR post DCCV TELEMETRY: SR 70's  10/25/15 TEE Study Conclusions - Left ventricle: Systolic function was mildly  reduced. The   estimated ejection fraction was in the range of 45% to 50%.   Hypokinesis of the anteroseptal myocardium. - Mitral valve: There was moderate regurgitation, with multiple   jets directed centrally. - Left atrium: The atrium was moderately dilated. No evidence of   thrombus in the atrial cavity or appendage. - Right atrium: The atrium was mildly dilated. No evidence of   thrombus in the atrial cavity or appendage. - Atrial septum: No defect or patent foramen ovale was identified. - Superior vena cava: The study excluded a thrombus.  02/25/13: [facility] FINDINGS:  LV showed good LV systolic function.  EF of approximately 50- 55%.  Left main was patent.  LAD was patent in proximal and mid portion and then was diffusely diseased distally and at the apex, LAD, and before reaching the apex.  Diagonal 1 and 2 were very small which were patent.  Ramus was very small, diffusely diseased.  Left circumflex was patent proximally and then tapers down in AV groove.  OM1 is very large which is patent.  OM2 is very small which is patent.  RCA is large which is patent.  PDA and PLV branches were patent.   Assessment and Plan:   1. New AFlutter with RVR     CHA2DS2Vasc is at least 2 if diabetic, started on Eliquis      Difficult rate control despite multiple meds, now s/p DCCV in SR     Remains on diltiazem gtt      Currently SR, 70's  Given body habitus, may not be a good ablation candidate, await Dr. Odessa Fleming evaluation and recommendations.  The patient tells me he feels a procedure would not necessarily be his 1st choice.       2. Morbid obesity, OSA, mention of hypoventilatory syndrome The patient states he drifts to sleep easily, even when driving and is instructed not to driv until his sleep apnea is formally evaluated and treated.     S/p brief intubation with TEE/DCCV  3. CHF likely secondary to his RVR     Weight down 10lbs     Fluid negative ? Only    Signed, Francis Dowse, PA-C 10/25/2015 11:13 AM  Morbidly obese gentleman with sleep apnea who presented with congestive heart failure atrial flutter with a rapid rate and modest LV dysfunction. He has excessive undergone cardioversion.  He was interviewed seen and examined this morning.  It is appropriate to consider catheter ablation as primary therapy. This is true not withstanding his size. However, following his cardioversion would be anticipated that 4-8 weeks would allow for some recovery of his LV function, S4 allow for undertaking the  procedure without concurrent anticoagulation which would reduce the risks of bleeding. He is much better having slept last night with CPAP. He is aware that he has severe sleep apnea admission also be treated as an laboratory impact on risks of atrial fibrillation in this gentleman with prior history of atrial flutter.  We'll be glad to see him as an outpatient in about 4-6 weeks with anticipation of catheter ablation a few weeks thereafter. We would hope that we could get an outpatient echocardiogram in about 6 weeks.  With LV dysfunction I would stop his calcium blocker and put him on a beta blocker I would also discontinue his aspirin.

## 2015-10-25 NOTE — Progress Notes (Signed)
Pt adm to PACU on Cardiazem drip at 10mg /hr. Dr Algie CofferKadakia here-wants drip left on. He saw 12 lead . Dr Hart RochesterHollis updated. Pt Ok to go to stepdown per Dr Algie CofferKadakia & Dr Hart RochesterHollis.

## 2015-10-25 NOTE — Progress Notes (Signed)
PROGRESS NOTE    Jesse Hurst  ZOX:[id number] DOB: April 30, 1960 DOA: 10/22/2015  PCP:  No PCP- has not seen is prior PCP in > 2 yrs   Brief Narrative:  55 y/o with h/o systolic CHF and an MI in the past who stopped taking his medications about 6 months after his MI, morbid obesity who presents for 2 wks of dyspnea, pedal edema and shortness of breath. He states he has been steadily gaining weight over the past 6 months. He has not seen a physician in nearly 2 yrs. 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 He is aware that he may have sleep apnea but has not thought about getting a sleep study. No recent chest pain or cough.   10/25/2015 - Patient was transferred to [facility] for cardioversion. Patient was sedated and converted to Normal sinus rhythm after administering 200 Joules of DC. Patient is maintained on IV Cardizem. Cardiology team is managing cardiac medication. Patient has been extubated and transferred to Step Down unit at [facility].  Subjective: Patient seen alongside patient's Nurse. Nil new complaints. No SOB, no chest pain, no fever or chills and no other constitutional symptoms.   Assessment & Plan:   Principal Problem: Acute CHF (congestive heart failure) - Diastolic.her  - Continue diuresis - Gradually wean off Cardizem drip. And consider beta blocker when Cardiology advises. - CT negative for PE - ECHO shows hyperdynamic LV - continue to diurese- he is improving - cont O2  Active Problems: A-flutter - S/P Cardioversion. Now on Cardizem drip. Continue anticoagulation. - Cardiology is directing care. - Needs OSA Work up and management as this may enhance patient to remain in Sinus rhythm.  Elevated D dimer - CT negative for PE -Venous duplex negative    Obesity, morbid - understands that he needs to lose weight  CAD  - With diffuse LAD disease on cath in 2014- not on medications -  cont ASA for now  Tobacco abuse - discussed stopping - states he does not need a nicotine patch    Dyslipidemia -HDL low-  not on statin   OSA - CPAP when sleeping- discussed with him that he needs a sleep study  Non compliance - Discussed need to comply with patient.   DVT prophylaxis: Eliquis Code Status: Full code Family Communication:  Disposition Plan: home. Consultants: Cardiology Procedures: S/P Cardioversion earlier today. Antimicrobials:  Anti-infectives    None       Objective: Vitals:   10/25/15 1200 10/25/15 1215 10/25/15 1235 10/25/15 1249  BP: 100/60 (!) 101/56    Pulse: 65 68 68 65  Resp: (!) 30 20 (!) 26 15  Temp:    97.5 F (36.4 C)  TempSrc:      SpO2: 95% 95% 99% 99%  Weight:      Height:        Intake/Output Summary (Last 24 hours) at 10/25/15 1301 Last data filed at 10/25/15 1252  Gross per 24 hour  Intake              600 ml  Output                2 ml  Net              598 ml   Filed Weights   10/23/15 0900 10/24/15 1300 10/25/15 0602  Weight: (!) 216.3 kg (476 lb 13.7 oz) (!) 217.3 kg (  479 lb) (!) 215.4 kg (474 lb 12.8 oz)    Examination: General exam: Morbidly obese. Appears comfortable  HEENT: PERRLA, oral mucosa moist, no sclera icterus or thrush Respiratory system: Decreased air entry globally. Cardiovascular system: S1 & S2 heard, RRR.    Gastrointestinal system: Abdomen is morbidly obese, some pitting edema lower abdominal area, soft, non-tender. Organs are difficult to assess. Central nervous system: Alert and oriented. No focal neurological deficits. Extremities: Bilateral leg edema, greater than 2.   Data Reviewed: I have personally reviewed following labs and imaging studies  CBC:  Recent Labs Lab 10/22/15 1430 10/23/15 0353 10/25/15 0507  WBC 11.3* 10.1 10.5  NEUTROABS 6.7  --   --   HGB 13.4 13.2 12.3*  HCT 42.2 43.1 39.2  MCV 91.7 93.9 90.7  PLT 273 263 236   Basic Metabolic Panel:  Recent Labs Lab  10/22/15 1430 10/23/15 0353 10/24/15 0536 10/25/15 0507  NA 140 139 137 136  K 4.0 4.3 3.8 3.5  CL 105 101 96* 97*  CO2 27 30 33* 32  GLUCOSE 101* 150* 144* 133*  BUN 13 13 14 15   CREATININE 1.06 1.21 1.03 0.92  CALCIUM 8.6* 8.8* 8.5* 8.2*   GFR: Estimated Creatinine Clearance: 177.7 mL/min (by C-G formula based on SCr of 0.92 mg/dL). Liver Function Tests: No results for input(s): AST, ALT, ALKPHOS, BILITOT, PROT, ALBUMIN in the last 168 hours. No results for input(s): LIPASE, AMYLASE in the last 168 hours. No results for input(s): AMMONIA in the last 168 hours. Coagulation Profile: No results for input(s): INR, PROTIME in the last 168 hours. Cardiac Enzymes:  Recent Labs Lab 10/22/15 1430  TROPONINI <0.03   BNP (last 3 results) No results for input(s): PROBNP in the last 8760 hours. HbA1C: No results for input(s): HGBA1C in the last 72 hours. CBG: No results for input(s): GLUCAP in the last 168 hours. Lipid Profile:  Recent Labs  10/23/15 0353  CHOL 123  HDL 24*  LDLCALC 81  TRIG 92  CHOLHDL 5.1   Thyroid Function Tests:  Recent Labs  10/22/15 1919  TSH 1.462   Anemia Panel: No results for input(s): VITAMINB12, FOLATE, FERRITIN, TIBC, IRON, RETICCTPCT in the last 72 hours. Urine analysis:    Component Value Date/Time   COLORURINE YELLOW 10/23/2015 0925   APPEARANCEUR CLEAR 10/23/2015 0925   LABSPEC 1.013 10/23/2015 0925   PHURINE 5.5 10/23/2015 0925   GLUCOSEU NEGATIVE 10/23/2015 0925   HGBUR NEGATIVE 10/23/2015 0925   BILIRUBINUR NEGATIVE 10/23/2015 0925   KETONESUR NEGATIVE 10/23/2015 0925   PROTEINUR NEGATIVE 10/23/2015 0925   NITRITE NEGATIVE 10/23/2015 0925   LEUKOCYTESUR NEGATIVE 10/23/2015 0925   Sepsis Labs: @LABRCNTIP (procalcitonin:4,lacticidven:4) )No results found for this or any previous visit (from the past 240 hour(s)).       Radiology Studies: Ct Angio Chest Pe W Or Wo Contrast  Result Date: 10/23/2015 CLINICAL DATA:   Shortness of breath.  Elevated D-dimer. EXAM: CT ANGIOGRAPHY CHEST WITH CONTRAST TECHNIQUE: Multidetector CT imaging of the chest was performed using the standard protocol during bolus administration of intravenous contrast. Multiplanar CT image reconstructions and MIPs were obtained to evaluate the vascular anatomy. CONTRAST:  100 cc Omnipaque 370 intravenous COMPARISON:  02/23/2013 FINDINGS: Cardiovascular: Normal heart size. No pericardial effusion. Negative thoracic aorta when accounting for calcification at ligamentum arteriosum. Evaluation of the pulmonary arteries is limited by patient size, bolus dispersion, and intermittent motion. This is likely best obtainable study under the circumstances. No visualized pulmonary embolism. Mediastinum: Stable  benign appearance of mediastinal lymph nodes. Lungs/Pleura: Streaky opacities with volume loss in the basilar lungs, greater on the left where there is multi segment lower lobe atelectasis. No edema, consolidation, effusion, or pneumothorax. Diffuse airway thickening. Upper abdomen: No acute findings. Partly seen rounded structure in the left upper quadrant posteriorly was also seen previously. This could be a 25 mm adrenal adenoma. Musculoskeletal: No chest wall mass or suspicious bone lesions identified. Bridging lower thoracic osteophytes Review of the MIP images confirms the above findings. IMPRESSION: 1. No evidence of pulmonary embolism. Evaluation of peripheral vessels is limited by patient factors. 2. Airway thickening and multi segment atelectasis. Electronically Signed   By: Marnee SpringJonathon  Watts M.D.   On: 10/23/2015 15:45      Scheduled Meds: . [MAR Hold] apixaban  5 mg Oral BID  . [MAR Hold] aspirin EC  81 mg Oral Daily  . [MAR Hold] furosemide  60 mg Intravenous BID  . [MAR Hold] sodium chloride flush  3 mL Intravenous Q12H   Continuous Infusions: . sodium chloride    . diltiazem (CARDIZEM) infusion 10 mg/hr (10/25/15 0845)     LOS: 3 days     Time spent in minutes: 35    Barnetta ChapelSylvester I Ogbata, MD [facility] Pager: [phone] [email or web address] Password [email or web address] 10/25/2015, 1:01 PM

## 2015-10-25 NOTE — Progress Notes (Signed)
Subjective:  Patient denies any chest pain shortness of breath palpitation or dizziness. But remains in atrial flutter with 2 to 1 conduction despite receiving multiple AV blocking medications  Objective:  Vital Signs in the last 24 hours: Temp:  [97.9 F (36.6 C)-99.2 F (37.3 C)] 97.9 F (36.6 C) (08/10 0620) Pulse Rate:  [139-144] 141 (08/10 0620) Resp:  [18-28] 20 (08/10 0620) BP: (90-117)/(34-87) 117/78 (08/10 0620) SpO2:  [89 %-96 %] 89 % (08/10 0620) Weight:  [474 lb 12.8 oz (215.4 kg)-479 lb (217.3 kg)] 474 lb 12.8 oz (215.4 kg) (08/10 0602)  Intake/Output from previous day: 08/09 0701 - 08/10 0700 In: 320 [P.O.:240; I.V.:80] Out: 2 [Urine:2] Intake/Output from this shift: No intake/output data recorded.  Physical Exam: Neck: no adenopathy, no carotid bruit, no JVD and supple, symmetrical, trachea midline Lungs: clear to auscultation bilaterally Heart: Tachycardic S1 and S2 soft Abdomen: soft, non-tender; bowel sounds normal; no masses,  no organomegaly Extremities: No clubbing cyanosis 4+ edema noted  Lab Results:  Recent Labs  10/23/15 0353 10/25/15 0507  WBC 10.1 10.5  HGB 13.2 12.3*  PLT 263 236    Recent Labs  10/24/15 0536 10/25/15 0507  NA 137 136  K 3.8 3.5  CL 96* 97*  CO2 33* 32  GLUCOSE 144* 133*  BUN 14 15  CREATININE 1.03 0.92    Recent Labs  10/22/15 1430  TROPONINI <0.03   Hepatic Function Panel No results for input(s): PROT, ALBUMIN, AST, ALT, ALKPHOS, BILITOT, BILIDIR, IBILI in the last 72 hours.  Recent Labs  10/23/15 0353  CHOL 123   No results for input(s): PROTIME in the last 72 hours.  Imaging: Imaging results have been reviewed and Ct Angio Chest Pe W Or Wo Contrast  Result Date: 10/23/2015 CLINICAL DATA:  Shortness of breath.  Elevated D-dimer. EXAM: CT ANGIOGRAPHY CHEST WITH CONTRAST TECHNIQUE: Multidetector CT imaging of the chest was performed using the standard protocol during bolus administration of intravenous  contrast. Multiplanar CT image reconstructions and MIPs were obtained to evaluate the vascular anatomy. CONTRAST:  100 cc Omnipaque 370 intravenous COMPARISON:  02/23/2013 FINDINGS: Cardiovascular: Normal heart size. No pericardial effusion. Negative thoracic aorta when accounting for calcification at ligamentum arteriosum. Evaluation of the pulmonary arteries is limited by patient size, bolus dispersion, and intermittent motion. This is likely best obtainable study under the circumstances. No visualized pulmonary embolism. Mediastinum: Stable benign appearance of mediastinal lymph nodes. Lungs/Pleura: Streaky opacities with volume loss in the basilar lungs, greater on the left where there is multi segment lower lobe atelectasis. No edema, consolidation, effusion, or pneumothorax. Diffuse airway thickening. Upper abdomen: No acute findings. Partly seen rounded structure in the left upper quadrant posteriorly was also seen previously. This could be a 25 mm adrenal adenoma. Musculoskeletal: No chest wall mass or suspicious bone lesions identified. Bridging lower thoracic osteophytes Review of the MIP images confirms the above findings. IMPRESSION: 1. No evidence of pulmonary embolism. Evaluation of peripheral vessels is limited by patient factors. 2. Airway thickening and multi segment atelectasis. Electronically Signed   By: Marnee Spring M.D.   On: 10/23/2015 15:45    Cardiac Studies:  Assessment/Plan:  Atrial flutter with 2 to 1 block with chads vasc score of 2 Decompensated congestive heart failure secondary to preserved LV systolic function Mild coronary artery disease with distal diffuse LAD stenosis. New-onset diabetes mellitus. Hyperlipidemia. Morbid obesity. Tobacco abuse. Obstructive sleep apnea/obesity hypoventilation syndrome. Plan Patient scheduled for TEE cardioversion today Discussed with patient regarding EP  consult for possible RF ablation, agrees to discuss with EP. Also discussed  with patient regarding lifestyle changes weight reduction diet and smoking cessation etc. Agree with sleep study.  LOS: 3 days    Jesse Hurst, Jesse Hurst 10/25/2015, 8:47 AM

## 2015-10-25 NOTE — CV Procedure (Signed)
PRE-OP DIAGNOSIS:  Atrial flutter, uncontrolled.  POST-OP DIAGNOSIS:  Sinus rhtyhm.  OPERATOR:  Orpah CobbAjay Andrae Claunch, MD.     ANESTHESIA:  Propafol.  COMPLICATIONS:  None.   OPERATIVE TERM:  DC cardioversion.  The nature of the procedure, risks and alternatives were discussed with the patient who gave informed consent.  OPERATIVE TECHNIQUE:  The patient was sedated with Propafol.  When the patient was no longer responsive to quiet voice, DC cardioversion was performed with 200 J biphasically and synchronously.  Patient converted to sinus rhythm. The patient was then monitored until fully alert and left the procedure area in stable condition. Due to intubation and extubation by anesthesia patient was monitored in PACU area until fully awake.  IMPRESSION:  Successful DC cardioversion. Dr. Sharyn LullHarwani, [facility] office and family notified post procedure.

## 2015-10-25 NOTE — Anesthesia Procedure Notes (Signed)
Procedure Name: Intubation Date/Time: 10/25/2015 10:29 AM Performed by: Jefm MilesENNIE, Tomiko Schoon E Pre-anesthesia Checklist: Patient identified, Emergency Drugs available, Suction available, Patient being monitored and Timeout performed Patient Re-evaluated:Patient Re-evaluated prior to inductionOxygen Delivery Method: Circle system utilized Preoxygenation: Pre-oxygenation with 100% oxygen Intubation Type: IV induction and Rapid sequence Ventilation: Mask ventilation with difficulty and Two handed mask ventilation required Laryngoscope Size: Glidescope and 4 Grade View: Grade I Tube type: Oral Tube size: 7.5 mm Number of attempts: 1 Airway Equipment and Method: Stylet and Video-laryngoscopy Placement Confirmation: ETT inserted through vocal cords under direct vision,  positive ETCO2 and breath sounds checked- equal and bilateral Secured at: 22 cm Tube secured with: Tape Dental Injury: Teeth and Oropharynx as per pre-operative assessment  Difficulty Due To: Difficulty was anticipated Future Recommendations: Recommend- induction with short-acting agent, and alternative techniques readily available

## 2015-10-26 ENCOUNTER — Encounter (HOSPITAL_COMMUNITY): Payer: Self-pay | Admitting: Cardiovascular Disease

## 2015-10-26 DIAGNOSIS — E785 Hyperlipidemia, unspecified: Secondary | ICD-10-CM

## 2015-10-26 DIAGNOSIS — I10 Essential (primary) hypertension: Secondary | ICD-10-CM

## 2015-10-26 MED ORDER — METOPROLOL SUCCINATE ER 25 MG PO TB24
50.0000 mg | ORAL_TABLET | Freq: Every day | ORAL | Status: DC
  Administered 2015-10-26 – 2015-10-28 (×3): 50 mg via ORAL
  Filled 2015-10-26 (×3): qty 2

## 2015-10-26 NOTE — Progress Notes (Signed)
Patient advised me that he "needs to take a freaken shower" I advised him that we will assist him with bathing and provide a bath basin. I also advised him to not get into shower and remain compliant with his hospital care.  Family at bedside.

## 2015-10-26 NOTE — Progress Notes (Signed)
Patient requesting to go home. I advised him that he is on a 100% oxygen at this time and the primary physician must evaluate his respiratory status. Patient cooperative at this time.

## 2015-10-26 NOTE — Discharge Summary (Signed)
Physician Discharge Summary  Jesse Hurst ZOX:[id number] DOB: 27-May-1960 DOA: 10/22/2015  PCP: [name], MD  Admit date: 10/22/2015 Discharge date: 10/28/2015  Time spent: 45 minutes  Recommendations for Outpatient Follow-up:  Recommended Patient follow-up with cardiology as well as Dr. Graciela Husbands, with electrophysiology, and 4-6 weeks. Patient should follow-up with his primary care physician within one week to discuss sterilization as well as monitor his labs. Of note patient did leave AGAINST MEDICAL ADVICE  Discharge Diagnoses:  Acute Diastolic heart failure A-flutter  Elevated D dimer Obesity, morbid CAD  Tobacco abuse Dyslipidemia OSA Non compliance  Discharge Condition: Stable  Diet recommendation: heart healthy  Filed Weights   10/26/15 0500 10/27/15 0448 10/28/15 0504  Weight: (!) 216.3 kg (476 lb 14.4 oz) (!) 214.3 kg (472 lb 6.4 oz) (!) 213.1 kg (469 lb 14.4 oz)    History of present illness:  On 10/22/2015 by Dr. Jeralyn Bennett Hurst Hurst is a 55 y.o. male with medical history significant of congestive heart failure, last transthoracic echocardiogram performed in 2014 that revealed an EF of 45-50% with left ventricle mildly dilated, presented to the emergency department with a three-day history of worsening shortness of breath associated with cough. He reports associated weight gain (approx 20 lbs) bilateral lower extremity edema and abdominal distension. He denied fevers, chills, sputum production, chest pain, nausea, vomiting. He had not been on diuretic therapy prior to this hospitalization.  Hospital Course:  Acute CHF (congestive heart failure) -Continue diuresis, Lasix 60 mg IV twice a day -CT negative for PE -Echocardiogram: EF 65-70% -Cardiology consulted and appreciated -Monitor intake and output, daily weights -UOP over past 24hours 4650cc -Weight is down about 15 pounds since admission -Patient still requiring 6L O2 while ambulating, O2 sats only up to  85% -Started on acetazolamide and losartan -Of note, prescriptions for diuretics were not given as patient will need close monitoring of labs with primary care physician and this has not been set up.  SVT -Yesterday evening, patient had episode of SVT, with HR in 180s -Vagal maneuvers were attempted -Cardiology called and patient give IV lopressor and placed on amiodarone drip -Patient currently back in SR, HR 70s -Pending further recommendations from cardiology -Prescriptions for amiodarone were not given as patient needs close monitoring and follow-up regarding loading dose as well as monitoring his LFTs TFTs and PFTs. -Patient was given prescription for Eliquis as well as metoprolol.  A-flutter -Initially placed on Cardizem drip -Cardiology consulted and appreciated -s/pCardioversion. -Patient currently in sinus rhythm -Continue metoprolol and Eliquis -Patient will need to follow-up with Dr. Graciela Husbands, EP, in 4-6 weeks  Elevated D dimer -CT negative for PE -Venous duplex negative DVT  Obesity, morbid -Upon discharge, patient will need follow-up with his primary care physician regarding last modifications including diet and exercise  CAD -With diffuse LAD disease on cath in 2014- not on medications -Continue Eliquis and metoprolol  Tobacco abuse -Discussed smoking cessation -Declined nicotine patch  Dyslipidemia -Lipid panel: TC 123, triglycerides 92, HDL 24, LDL 81 -Currently not on statin -Patient should discuss lifestyle modifications including diet and exercise with his PCP upon discharge  Obstructive sleep apnea -CPAP when sleeping -Patient will need sleep apnea testing at discharge  Noncompliance -Patient counseled on the need for compliance with doctor's appointments as well as medications -Patient continues to state he will leave against medical advice, as he wants to go home.  He states he has a Therapist, occupational and has an important  meeting in the morning.  I explained to him that he is not medically stable for discharge, and that he had a very deadly heart rhythm yesterday.    Procedures: Cardioversion/TEE  Consultations: Cardiology EP  Discharge Exam: Vitals:   10/28/15 1024 10/28/15 1100  BP: 111/72 102/67  Pulse: 76 75  Resp: (!) 0 (!) 0  Temp:  98.7 F (37.1 C)   No Physical Exam on discharge, as he left AMA.  Discharge Instructions  Current Discharge Medication List    START taking these medications   Details  apixaban (ELIQUIS) 5 MG TABS tablet Take 1 tablet (5 mg total) by mouth 2 (two) times daily. Qty: 60 tablet, Refills: 0    metoprolol succinate (TOPROL-XL) 50 MG 24 hr tablet Take 1 tablet (50 mg total) by mouth daily. Qty: 30 tablet, Refills: 0       No Known Allergies Follow-up Information    Jesse MangesSteven Klein, MD Follow up on 12/26/2015.   Specialty:  Cardiology Why:  8:15AM Contact information: [address] [place] [address] [phone]        Jeoffrey MassedMCGOWEN,PHILIP H, MD. Schedule an appointment as soon as possible for a visit today.   Specialty:  Family Medicine Why:  Hospital follow up, repeat BMP Contact information: [address] [place] [address] [phone]        Rinaldo CloudHarwani, Mohan, MD. Schedule an appointment as soon as possible for a visit in 1 week(s).   Specialty:  Cardiology Why:  heart rhythm  Contact information: [address] [place] [address] [phone]            The results of significant diagnostics from this hospitalization (including imaging, microbiology, ancillary and laboratory) are listed below for reference.    Significant Diagnostic Studies: Dg Chest 2 View  Result Date: 10/22/2015 CLINICAL DATA:  55 year old male with shortness of breath. Initial encounter. EXAM: CHEST  2 VIEW COMPARISON:  Chest CTA and chest radiographs 02/23/2013 FINDINGS: Large body habitus. Mild cardiomegaly is  stable to slightly increased. Other mediastinal contours remain normal. Visualized tracheal air column is within normal limits. Chronic but increased pulmonary interstitial opacity. No pneumothorax. No pleural effusion. No consolidation. No acute osseous abnormality identified. IMPRESSION: Chronic but increased pulmonary interstitial opacity and cardiomegaly. Favor pulmonary interstitial edema. Main differential consideration is viral/ atypical respiratory infection. No definite pleural effusion. Electronically Signed   By: Odessa FlemingH  Hall M.D.   On: 10/22/2015 15:08   Ct Angio Chest Pe W Or Wo Contrast  Result Date: 10/23/2015 CLINICAL DATA:  Shortness of breath.  Elevated D-dimer. EXAM: CT ANGIOGRAPHY CHEST WITH CONTRAST TECHNIQUE: Multidetector CT imaging of the chest was performed using the standard protocol during bolus administration of intravenous contrast. Multiplanar CT image reconstructions and MIPs were obtained to evaluate the vascular anatomy. CONTRAST:  100 cc Omnipaque 370 intravenous COMPARISON:  02/23/2013 FINDINGS: Cardiovascular: Normal heart size. No pericardial effusion. Negative thoracic aorta when accounting for calcification at ligamentum arteriosum. Evaluation of the pulmonary arteries is limited by patient size, bolus dispersion, and intermittent motion. This is likely best obtainable study under the circumstances. No visualized pulmonary embolism. Mediastinum: Stable benign appearance of mediastinal lymph nodes. Lungs/Pleura: Streaky opacities with volume loss in the basilar lungs, greater on the left where there is multi segment lower lobe atelectasis. No edema, consolidation, effusion, or pneumothorax. Diffuse airway thickening. Upper abdomen: No acute findings. Partly seen rounded structure in the left upper quadrant posteriorly was also seen previously. This could be  a 25 mm adrenal adenoma. Musculoskeletal: No chest wall mass or suspicious bone lesions identified. Bridging lower thoracic  osteophytes Review of the MIP images confirms the above findings. IMPRESSION: 1. No evidence of pulmonary embolism. Evaluation of peripheral vessels is limited by patient factors. 2. Airway thickening and multi segment atelectasis. Electronically Signed   By: Marnee Spring M.D.   On: 10/23/2015 15:45    Microbiology: Recent Results (from the past 240 hour(s))  MRSA PCR Screening     Status: None   Collection Time: 10/25/15  2:16 PM  Result Value Ref Range Status   MRSA by PCR NEGATIVE NEGATIVE Final    Comment:        The GeneXpert MRSA Assay (FDA approved for NASAL specimens only), is one component of a comprehensive MRSA colonization surveillance program. It is not intended to diagnose MRSA infection nor to guide or monitor treatment for MRSA infections.      Labs: Basic Metabolic Panel:  Recent Labs Lab 10/23/15 0353 10/24/15 0536 10/25/15 0507 10/27/15 0304 10/28/15 0425  NA 139 137 136 137 136  K 4.3 3.8 3.5 3.8 3.4*  CL 101 96* 97* 90* 90*  CO2 30 33* 32 37* 36*  GLUCOSE 150* 144* 133* 138* 128*  BUN CREATININE 1.21 1.03 0.92 0.85 0.90  CALCIUM 8.8* 8.5* 8.2* 8.6* 8.5*   Liver Function Tests: No results for input(s): AST, ALT, ALKPHOS, BILITOT, PROT, ALBUMIN in the last 168 hours. No results for input(s): LIPASE, AMYLASE in the last 168 hours. No results for input(s): AMMONIA in the last 168 hours. CBC:  Recent Labs Lab 10/22/15 1430 10/23/15 0353 10/25/15 0507 10/27/15 0304 10/28/15 0425  WBC 11.3* 10.1 10.5 8.8 8.9  NEUTROABS 6.7  --   --   --   --   HGB 13.4 13.2 12.3* 12.9* 11.8*  HCT 42.2 43.1 39.2 42.9 40.3  MCV 91.7 93.9 90.7 95.3 96.2  PLT 273 263 236 223 223   Cardiac Enzymes:  Recent Labs Lab 10/22/15 1430  TROPONINI <0.03   BNP: BNP (last 3 results)  Recent Labs  10/22/15 1430  BNP 72.8    ProBNP (last 3 results) No results for input(s): PROBNP in the last 8760 hours.  CBG: No results for input(s):  GLUCAP in the last 168 hours.     SignedEdsel Petrin  [facility] 10/28/2015, 12:53 PM

## 2015-10-26 NOTE — Progress Notes (Signed)
Patient up in room to use restroom. Advised to please urinate in a measuring device. Patient refused therefore urine output recorded volume will not be accurate.

## 2015-10-26 NOTE — Progress Notes (Signed)
Pt O2 sats dropping to the 70s on 6L [place], NRB mask applied, O2 sats 96% on NRB.

## 2015-10-26 NOTE — Progress Notes (Signed)
PROGRESS NOTE    Jesse Hurst  ZOX:[id number] DOB: 03-Mar-1961 DOA: 10/22/2015 PCP: [name], MD   Chief Complaint  Patient presents with  . Tachycardia    Brief Narrative:  55 y/o with h/o systolic CHF and an MI in the past who stopped taking his medications about 6 months after his MI, morbid obesity who presents for 2 wks of dyspnea, pedal edema and shortness of breath. He states he has been steadily gaining weight over the past 6 months. He has not seen a physician in nearly 2 yrs. 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 He is aware that he may have sleep apnea but has not thought about getting a sleep study. No recent chest pain or cough.   10/25/2015 - Patient was transferred to [facility] for cardioversion. Patient was sedated and converted to Normal sinus rhythm after administering 200 Joules of DC. Patient is maintained on IV Cardizem. Cardiology team is managing cardiac medication. Patient has been extubated and transferred to Step Down unit at [facility]. Assessment & Plan  Acute CHF (congestive heart failure) -Continue diuresis, Lasix 60 mg IV twice a day -CT negative for PE -Echocardiogram: EF 65-70% -Cardiology consulted and appreciated -Monitor intake and output, daily weights  A-flutter  -Initially placed on Cardizem drip -Cardiology consulted and appreciated -s/p Cardioversion. -Patient currently in sinus rhythm -Continue metoprolol and Eliquis -Patient will need to follow-up with Dr. Graciela Husbands, EP, in 4-6 weeks  Elevated D dimer -CT negative for PE -Venous duplex negative DVT  Obesity, morbid -Upon discharge, patient will need follow-up with his primary care physician regarding last modifications including diet and exercise  CAD  -With diffuse LAD disease on cath in 2014- not on medications -Continue Eliquis and metoprolol  Tobacco abuse -Discussed smoking  cessation -Declined nicotine patch  Dyslipidemia -Lipid panel: TC 123, triglycerides 92, HDL 24, LDL 81 -Currently not on statin -Patient should discuss lifestyle modifications including diet and exercise with his PCP upon discharge  Obstructive sleep apnea -CPAP when sleeping -Patient will need sleep apnea testing at discharge  Noncompliance  -Patient counseled on the need for compliance with doctor's appointments as well as medications  DVT Prophylaxis  Eliquis  Code Status: Full  Family Communication: None at bedside  Disposition Plan: Admitted, continue diureses, continue to monitor in stepdown  Consultants Cardiology EP  Procedures  Echocardiogram TEE/DCCV  Antibiotics   Anti-infectives    None      Subjective:   Jesse Hurst seen and examined today.  Patient is very eager to go home. He states he has lots of 42. Currently denies any chest pain, shortness of breath, abdominal pain, nausea or vomiting, diarrhea or constipation. Patient does state he gets short winded at night when he is sleeping.  Objective:   Vitals:   10/26/15 0700 10/26/15 0800 10/26/15 0900 10/26/15 1200  BP: (!) 117/54 (!) 100/59 108/79 (!) 120/59  Pulse: 81 89 88 92  Resp: (!) 28 (!) 24 (!) 28 20  Temp:  98.2 F (36.8 C)  99.4 F (37.4 C)  TempSrc:  Oral  Oral  SpO2:  97%  95%  Weight:      Height:        Intake/Output Summary (Last 24 hours) at 10/26/15 1327 Last data filed at 10/26/15 0600  Gross per 24 hour  Intake              130  ml  Output                0 ml  Net              130 ml   Filed Weights   10/24/15 1300 10/25/15 0602 10/26/15 0500  Weight: (!) 217.3 kg (479 lb) (!) 215.4 kg (474 lb 12.8 oz) (!) 216.3 kg (476 lb 14.4 oz)    Exam  General: Well developed, well nourished, NAD, appears stated age  HEENT: NCAT,mucous membranes moist. Currently on NRB  Cardiovascular: S1 S2 auscultated, 1/6SEM,  Regular rate and rhythm.  Respiratory: Diminished  breath sounds, no wheezing  Abdomen: Soft, obese, nontender, nondistended, + bowel sounds  Extremities: warm dry without cyanosis clubbing. 2+Edema LE B/L   Neuro: AAOx3, nonfocal  Psych: Normal affect and demeanor with intact judgement and insight   Data Reviewed: I have personally reviewed following labs and imaging studies  CBC:  Recent Labs Lab 10/22/15 1430 10/23/15 0353 10/25/15 0507  WBC 11.3* 10.1 10.5  NEUTROABS 6.7  --   --   HGB 13.4 13.2 12.3*  HCT 42.2 43.1 39.2  MCV 91.7 93.9 90.7  PLT 273 263 236   Basic Metabolic Panel:  Recent Labs Lab 10/22/15 1430 10/23/15 0353 10/24/15 0536 10/25/15 0507  NA 140 139 137 136  K 4.0 4.3 3.8 3.5  CL 105 101 96* 97*  CO2 27 30 33* 32  GLUCOSE 101* 150* 144* 133*  BUN CREATININE 1.06 1.21 1.03 0.92  CALCIUM 8.6* 8.8* 8.5* 8.2*   GFR: Estimated Creatinine Clearance: 178.1 mL/min (by C-G formula based on SCr of 0.92 mg/dL). Liver Function Tests: No results for input(s): AST, ALT, ALKPHOS, BILITOT, PROT, ALBUMIN in the last 168 hours. No results for input(s): LIPASE, AMYLASE in the last 168 hours. No results for input(s): AMMONIA in the last 168 hours. Coagulation Profile: No results for input(s): INR, PROTIME in the last 168 hours. Cardiac Enzymes:  Recent Labs Lab 10/22/15 1430  TROPONINI <0.03   BNP (last 3 results) No results for input(s): PROBNP in the last 8760 hours. HbA1C: No results for input(s): HGBA1C in the last 72 hours. CBG: No results for input(s): GLUCAP in the last 168 hours. Lipid Profile: No results for input(s): CHOL, HDL, LDLCALC, TRIG, CHOLHDL, LDLDIRECT in the last 72 hours. Thyroid Function Tests: No results for input(s): TSH, T4TOTAL, FREET4, T3FREE, THYROIDAB in the last 72 hours. Anemia Panel: No results for input(s): VITAMINB12, FOLATE, FERRITIN, TIBC, IRON, RETICCTPCT in the last 72 hours. Urine analysis:    Component Value Date/Time   COLORURINE YELLOW  10/23/2015 0925   APPEARANCEUR CLEAR 10/23/2015 0925   LABSPEC 1.013 10/23/2015 0925   PHURINE 5.5 10/23/2015 0925   GLUCOSEU NEGATIVE 10/23/2015 0925   HGBUR NEGATIVE 10/23/2015 0925   BILIRUBINUR NEGATIVE 10/23/2015 0925   KETONESUR NEGATIVE 10/23/2015 0925   PROTEINUR NEGATIVE 10/23/2015 0925   NITRITE NEGATIVE 10/23/2015 0925   LEUKOCYTESUR NEGATIVE 10/23/2015 0925   Sepsis Labs: (procalcitonin:4,lacticidven:4)  ) Recent Results (from the past 240 hour(s))  MRSA PCR Screening     Status: None   Collection Time: 10/25/15  2:16 PM  Result Value Ref Range Status   MRSA by PCR NEGATIVE NEGATIVE Final    Comment:        The GeneXpert MRSA Assay (FDA approved for NASAL specimens only), is one component of a comprehensive MRSA colonization surveillance program. It is not intended to diagnose MRSA infection nor  to guide or monitor treatment for MRSA infections.       Radiology Studies: No results found.   Scheduled Meds: . apixaban  5 mg Oral BID  . furosemide  60 mg Intravenous BID  . metoprolol succinate  50 mg Oral Daily  . sodium chloride flush  3 mL Intravenous Q12H   Continuous Infusions:    LOS: 4 days   Time Spent in minutes   30 minutes  Kyren Vaux D.O. on 10/26/2015 at 1:27 PM  Between 7am to 7pm - Pager - [phone]  After 7pm go to [email or web address] - password [email or web address]  And look for the night coverage person covering for me after hours  [facility] Office  [phone]

## 2015-10-26 NOTE — Progress Notes (Signed)
This note also relates to the following rows which could not be included: Pulse Rate - Cannot attach notes to unvalidated device data  RT placed patient back on v60 bipap mode. Patient O2 sat continued to drop on black box cpap. Patient is currently resting comfortably on 50% fio2. RT will continue to monitor as needed.

## 2015-10-26 NOTE — Evaluation (Signed)
Physical Therapy Evaluation Patient Details Name: Jesse Hurst MRN: [id number] DOB: [birth year] Today's Date: 10/26/2015   History of Present Illness  Patient is a 56 yo male admitted 10/22/15 with SOB, cough, weight gain, LE edema.  Patient with CHF exacerbation.  Patient with Aflutter, s/p TEE/DCCV.    PMH:  PSVT, ACS, morbid obesity, tobacco use    Clinical Impression  Patient is functioning at Mod I to independent level with all mobility and gait.  Good balance with high level balance activities.  Patient did desat to 85% on 6 liters O2 with gait.  RN notified.  Reviewed pursed-lip breathing.  No acute PT needs identified - PT will sign off.  Would patient benefit from OP Cardiac/Pulmonary Rehab Program?    Follow Up Recommendations No PT follow up;Supervision - Intermittent    Equipment Recommendations  None recommended by PT    Recommendations for Other Services Other (comment) (OP Cardiac/Pulmonary Rehab Program)     Precautions / Restrictions Precautions Precautions: None Precaution Comments: on O2 Restrictions Weight Bearing Restrictions: No      Mobility  Bed Mobility               General bed mobility comments: Patient in chair  Transfers Overall transfer level: Modified independent Equipment used: None             General transfer comment: Increased time.  Rocking for momentum to stand from low chair.  Good balance in stance.  Ambulation/Gait Ambulation/Gait assistance: Independent Ambulation Distance (Feet): 46 Feet Assistive device: None Gait Pattern/deviations: Step-through pattern Gait velocity: decreased (Decreased to attempt to minimize O2 sat drop.) Gait velocity interpretation: Below normal speed for age/gender General Gait Details: Patient with good gait pattern.  Patient ambulating on 6 liters O2 and desaturated to 85% in 40'.    Stairs            Wheelchair Mobility    Modified Rankin (Stroke Patients Only)       Balance  Overall balance assessment: Independent;No apparent balance deficits (not formally assessed)                           High level balance activites: Backward walking;Direction changes;Turns;Head turns High Level Balance Comments: No loss of balance with high level balance activities             Pertinent Vitals/Pain Pain Assessment: No/denies pain    Home Living Family/patient expects to be discharged to:: Private residence Living Arrangements: Spouse/significant other Available Help at Discharge: Family;Available 24 hours/day (Wife and children) Type of Home: House Home Access: Stairs to enter Entrance Stairs-Rails: None Entrance Stairs-Number of Steps: 4 Home Layout: Multi-level;Able to live on main level with bedroom/bathroom Home Equipment: None      Prior Function Level of Independence: Independent         Comments: Patient owns plumbing business.     Hand Dominance        Extremity/Trunk Assessment   Upper Extremity Assessment: Overall WFL for tasks assessed           Lower Extremity Assessment: Overall WFL for tasks assessed         Communication   Communication: No difficulties  Cognition Arousal/Alertness: Awake/alert Behavior During Therapy: WFL for tasks assessed/performed Overall Cognitive Status: Within Functional Limits for tasks assessed                      General Comments General comments (skin  integrity, edema, etc.): Reviewed pursed-lip breathing    Exercises        Assessment/Plan    PT Assessment Patent does not need any further PT services  PT Diagnosis Generalized weakness   PT Problem List    PT Treatment Interventions     PT Goals (Current goals can be found in the Care Plan section) Acute Rehab PT Goals PT Goal Formulation: All assessment and education complete, DC therapy    Frequency     Barriers to discharge        Co-evaluation               End of Session Equipment Utilized  During Treatment: Oxygen Activity Tolerance: Treatment limited secondary to medical complications (Comment) (Limited due to O2 sat drop) Patient left: in chair;with call bell/phone within reach;with nursing/sitter in room;with family/visitor present Nurse Communication: Mobility status (Drop in O2 sat to 85% on O2 at 6 l/min in 40')         Time: 1225-1243 PT Time Calculation (min) (ACUTE ONLY): 18 min   Charges:   PT Evaluation $PT Eval Moderate Complexity: 1 Procedure     PT G CodesVena Hurst:        Jesse Hurst 10/26/2015, 1:18 PM Durenda HurtSusan Hurst. Renaldo Fiddleravis, PT, [facility] Acute Rehab Services Pager [phone]

## 2015-10-26 NOTE — Progress Notes (Signed)
Subjective: Patient denies any chest pain or shortness of breath. Remains in sinus rhythm after TEE assisted cardioversion. Awaiting EP evaluation for possible flutter ablation. Eager to go home  Objective:  Vital Signs in the last 24 hours: Temp:  [96.8 F (36 C)-98.7 F (37.1 C)] 96.8 F (36 C) (08/11 0303) Pulse Rate:  [64-141] 81 (08/11 0700) Resp:  [12-30] 28 (08/11 0700) BP: (89-133)/(28-68) 117/54 (08/11 0700) SpO2:  [77 %-99 %] 94 % (08/11 0607) FiO2 (%):  [60 %-92 %] 92 % (08/10 1305) Weight:  [476 lb 14.4 oz (216.3 kg)] 476 lb 14.4 oz (216.3 kg) (08/11 0500)  Intake/Output from previous day: 08/10 0701 - 08/11 0700 In: 650 [I.V.:650] Out: -  Intake/Output from this shift: No intake/output data recorded.  Physical Exam: Neck: no adenopathy, no carotid bruit, no JVD and supple, symmetrical, trachea midline Lungs: Decreased breath sound at bases Heart: regular rate and rhythm, S1, S2 normal and Soft systolic murmur noted Abdomen: soft, non-tender; bowel sounds normal; no masses,  no organomegaly Extremities: No clubbing cyanosis 3+ edema noted  Lab Results:  Recent Labs  10/25/15 0507  WBC 10.5  HGB 12.3*  PLT 236    Recent Labs  10/24/15 0536 10/25/15 0507  NA 137 136  K 3.8 3.5  CL 96* 97*  CO2 33* 32  GLUCOSE 144* 133*  BUN 14 15  CREATININE 1.03 0.92   No results for input(s): TROPONINI in the last 72 hours.  Invalid input(s): CK, MB Hepatic Function Panel No results for input(s): PROT, ALBUMIN, AST, ALT, ALKPHOS, BILITOT, BILIDIR, IBILI in the last 72 hours. No results for input(s): CHOL in the last 72 hours. No results for input(s): PROTIME in the last 72 hours.  Imaging: Imaging results have been reviewed and No results found.  Cardiac Studies:  Assessment/Plan:  Atrial flutter with 2 to 1 block with chads vasc score of 2 Decompensated congestive heart failure secondary to preserved LV systolic function Mild coronary artery disease with  distal diffuse LAD stenosis. New-onset diabetes mellitus. Hyperlipidemia. Morbid obesity. Tobacco abuse. Obstructive sleep apnea/obesity hypoventilation syndrome. Plan Change IV Cardizem to by mouth as per orders  If Discharged follow-up with me in one week  Discussed with patient regarding lifestyle changes weight reduction diet and compliance with follow-up and medications Patient will need C Pap at home Consider pulmonary evaluation   LOS: 4 days    Rinaldo CloudHarwani, Cerrone Debold 10/26/2015, 9:22 AM

## 2015-10-26 NOTE — Progress Notes (Signed)
SATURATION QUALIFICATIONS: (This note is used to comply with regulatory documentation for home oxygen)  Patient Saturations on Room Air at Rest = 85%    Patient Saturations on 4 Liters of oxygen  = 95%  Please briefly explain why patient needs home oxygen: hypoxia

## 2015-10-26 NOTE — Care Management Note (Signed)
Case Management Note  Patient Details  Name: Jesse Hurst MRN: [id number] Date of Birth: 12/11/1960  Subjective/Objective:   Pt to discharge on home O2 and CPAP - setting 5-20.  [facility], pt to call to schedule appointment when discharged.                           Expected Discharge Plan:  Home/Self Care  Discharge planning Services  CM Consult  Post Acute Care Choice:  Durable Medical Equipment Choice offered to:     DME Arranged:  Oxygen, Continuous positive airway pressure (CPAP) DME Agency:  [facility]  Status of Service:  Completed, signed off  Magdalene RiverMayo, Kacen Mellinger T, RN 10/26/2015, 3:42 PM

## 2015-10-27 LAB — CBC
HCT: 42.9 % (ref 39.0–52.0)
Hemoglobin: 12.9 g/dL — ABNORMAL LOW (ref 13.0–17.0)
MCH: 28.7 pg (ref 26.0–34.0)
MCHC: 30.1 g/dL (ref 30.0–36.0)
MCV: 95.3 fL (ref 78.0–100.0)
PLATELETS: 223 10*3/uL (ref 150–400)
RBC: 4.5 MIL/uL (ref 4.22–5.81)
RDW: 14 % (ref 11.5–15.5)
WBC: 8.8 10*3/uL (ref 4.0–10.5)

## 2015-10-27 LAB — BASIC METABOLIC PANEL
ANION GAP: 10 (ref 5–15)
BUN: 11 mg/dL (ref 6–20)
CALCIUM: 8.6 mg/dL — AB (ref 8.9–10.3)
CO2: 37 mmol/L — ABNORMAL HIGH (ref 22–32)
Chloride: 90 mmol/L — ABNORMAL LOW (ref 101–111)
Creatinine, Ser: 0.85 mg/dL (ref 0.61–1.24)
Glucose, Bld: 138 mg/dL — ABNORMAL HIGH (ref 65–99)
Potassium: 3.8 mmol/L (ref 3.5–5.1)
Sodium: 137 mmol/L (ref 135–145)

## 2015-10-27 MED ORDER — AMIODARONE HCL IN DEXTROSE 360-4.14 MG/200ML-% IV SOLN
30.0000 mg/h | INTRAVENOUS | Status: DC
  Administered 2015-10-28 (×2): 30 mg/h via INTRAVENOUS
  Filled 2015-10-27: qty 200

## 2015-10-27 MED ORDER — LOSARTAN POTASSIUM 50 MG PO TABS
25.0000 mg | ORAL_TABLET | Freq: Every day | ORAL | Status: DC
  Administered 2015-10-27 – 2015-10-28 (×2): 25 mg via ORAL
  Filled 2015-10-27 (×2): qty 1

## 2015-10-27 MED ORDER — ACETAZOLAMIDE 250 MG PO TABS
250.0000 mg | ORAL_TABLET | Freq: Two times a day (BID) | ORAL | Status: DC
  Administered 2015-10-27 – 2015-10-28 (×3): 250 mg via ORAL
  Filled 2015-10-27 (×3): qty 1

## 2015-10-27 MED ORDER — AMIODARONE LOAD VIA INFUSION
150.0000 mg | Freq: Once | INTRAVENOUS | Status: AC
  Administered 2015-10-27: 150 mg via INTRAVENOUS
  Filled 2015-10-27: qty 83.34

## 2015-10-27 MED ORDER — METOPROLOL TARTRATE 5 MG/5ML IV SOLN
INTRAVENOUS | Status: AC
Start: 2015-10-27 — End: 2015-10-27
  Administered 2015-10-27: 2.5 mg
  Filled 2015-10-27: qty 5

## 2015-10-27 MED ORDER — AMIODARONE HCL IN DEXTROSE 360-4.14 MG/200ML-% IV SOLN
60.0000 mg/h | INTRAVENOUS | Status: DC
  Administered 2015-10-27 (×2): 60 mg/h via INTRAVENOUS
  Filled 2015-10-27 (×2): qty 200

## 2015-10-27 MED ORDER — METOPROLOL TARTRATE 5 MG/5ML IV SOLN: 2.5000 mg | INTRAVENOUS | Status: DC | PRN

## 2015-10-27 NOTE — Progress Notes (Signed)
Subjective:  Patient denies any chest pain states breathing and leg swelling has improved eager to go home. Diuresing well. Still volume overloaded. Remains in sinus rhythm. Bicarbonate trending up secondary to Lasix.  Objective:  Vital Signs in the last 24 hours: Temp:  [98.4 F (36.9 C)-99.4 F (37.4 C)] 99.1 F (37.3 C) (08/12 0800) Pulse Rate:  [78-102] 102 (08/12 0800) Resp:  [18-25] 22 (08/12 0800) BP: (108-137)/(54-67) 137/59 (08/12 0800) SpO2:  [92 %-99 %] 93 % (08/12 0800) Weight:  [472 lb 6.4 oz (214.3 kg)] 472 lb 6.4 oz (214.3 kg) (08/12 0448)  Intake/Output from previous day: 08/11 0701 - 08/12 0700 In: 1083 [P.O.:1080; I.V.:3] Out: -  Intake/Output from this shift: Total I/O In: 360 [P.O.:360] Out: 1550 [Urine:1550]  Physical Exam: Neck: no adenopathy, no carotid bruit, no JVD and supple, symmetrical, trachea midline Lungs: Decreased breath sound at bases Heart: regular rate and rhythm, S1, S2 normal and Soft systolic murmur noted Abdomen: soft, non-tender; bowel sounds normal; no masses,  no organomegaly Extremities: No clubbing cyanosis 3+ edema noted  Lab Results:  Recent Labs  10/25/15 0507 10/27/15 0304  WBC 10.5 8.8  HGB 12.3* 12.9*  PLT 236 223    Recent Labs  10/25/15 0507 10/27/15 0304  NA 136 137  K 3.5 3.8  CL 97* 90*  CO2 32 37*  GLUCOSE 133* 138*  BUN 15 11  CREATININE 0.92 0.85   No results for input(s): TROPONINI in the last 72 hours.  Invalid input(s): CK, MB Hepatic Function Panel No results for input(s): PROT, ALBUMIN, AST, ALT, ALKPHOS, BILITOT, BILIDIR, IBILI in the last 72 hours. No results for input(s): CHOL in the last 72 hours. No results for input(s): PROTIME in the last 72 hours.  Imaging: Imaging results have been reviewed and No results found.  Cardiac Studies:  Assessment/Plan:  Atrial flutter with 2 to 1block with chads vasc score of 2 Decompensated congestive heart failure secondary to preserved LV  systolic function Mild coronary artery disease with distal diffuse LAD stenosis. New-onset diabetes mellitus. Hyperlipidemia. Morbid obesity. Tobacco abuse. Obstructive sleep apnea/obesity hypoventilation syndrome. Metabolic alkalosis Plan Start acetazolamide as per orders in view of metabolic alkalosis Add low-dose ARB in view of new-onset diabetes I will sign off please call if needed  LOS: 5 days    Rinaldo CloudHarwani, Tyreonna Czaplicki 10/27/2015, 11:10 AM

## 2015-10-27 NOTE — Progress Notes (Signed)
PROGRESS NOTE    Jesse Hurst  UEA:[id number] DOB: 24-Dec-1960 DOA: 10/22/2015 PCP: [name], MD   Chief Complaint  Patient presents with  . Tachycardia    Brief Narrative:  55 y/o with h/o systolic CHF and an MI in the past who stopped taking his medications about 6 months after his MI, morbid obesity who presents for 2 wks of dyspnea, pedal edema and shortness of breath. He states he has been steadily gaining weight over the past 6 months. He has not seen a physician in nearly 2 yrs. 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 He is aware that he may have sleep apnea but has not thought about getting a sleep study. No recent chest pain or cough.   10/25/2015 - Patient was transferred to [facility] for cardioversion. Patient was sedated and converted to Normal sinus rhythm after administering 200 Joules of DC. Patient is maintained on IV Cardizem. Cardiology team is managing cardiac medication. Patient has been extubated and transferred to Step Down unit at [facility]  Assessment & Plan  Acute CHF (congestive heart failure) -Continue diuresis, Lasix 60 mg IV twice a day -CT negative for PE -Echocardiogram: EF 65-70% -Cardiology consulted and appreciated -Monitor intake and output, daily weights - not sure why intake/output are not being charted. Have spoken to nursing regarding this. -Weight is down about 15 pounds since admission -Patient still requiring 6L O2 while ambulating, O2 sats only up to 85%  A-flutter  -Initially placed on Cardizem drip -Cardiology consulted and appreciated -s/p Cardioversion. -Patient currently in sinus rhythm -Continue metoprolol and Eliquis -Patient will need to follow-up with Dr. Graciela Husbands, EP, in 4-6 weeks  Elevated D dimer -CT negative for PE -Venous duplex negative DVT  Obesity, morbid -Upon discharge, patient will need follow-up with his primary care  physician regarding last modifications including diet and exercise  CAD  -With diffuse LAD disease on cath in 2014- not on medications -Continue Eliquis and metoprolol  Tobacco abuse -Discussed smoking cessation -Declined nicotine patch  Dyslipidemia -Lipid panel: TC 123, triglycerides 92, HDL 24, LDL 81 -Currently not on statin -Patient should discuss lifestyle modifications including diet and exercise with his PCP upon discharge  Obstructive sleep apnea -CPAP when sleeping -Patient will need sleep apnea testing at discharge  Noncompliance  -Patient counseled on the need for compliance with doctor's appointments as well as medications  DVT Prophylaxis  Eliquis  Code Status: Full  Family Communication: None at bedside  Disposition Plan: Admitted, continue diureses, continue to monitor in stepdown  Consultants Cardiology EP  Procedures  Echocardiogram TEE/DCCV  Antibiotics   Anti-infectives    None      Subjective:   Jesse Hurst seen and examined today.  Patient is very eager to go home.  Denies chest pain, shortness of breath, abdominal pain, nausea or vomiting, diarrhea or constipation.   Objective:   Vitals:   10/27/15 0136 10/27/15 0312 10/27/15 0448 10/27/15 0800  BP: (!) 116/59 121/67  (!) 137/59  Pulse: 93 93  (!) 102  Resp: (!) 24 (!) 22  (!) 22  Temp:  98.4 F (36.9 C)  99.1 F (37.3 C)  TempSrc:  Axillary  Oral  SpO2: 95% 99%  93%  Weight:   (!) 214.3 kg (472 lb 6.4 oz)   Height:        Intake/Output Summary (Last 24 hours) at 10/27/15 1041 Last data filed at  10/27/15 0953  Gross per 24 hour  Intake             1083 ml  Output             1550 ml  Net             -467 ml   Filed Weights   10/25/15 0602 10/26/15 0500 10/27/15 0448  Weight: (!) 215.4 kg (474 lb 12.8 oz) (!) 216.3 kg (476 lb 14.4 oz) (!) 214.3 kg (472 lb 6.4 oz)    Exam  General: Well developed, well nourished, NAD  HEENT: NCAT,mucous membranes moist.  Currently on CPAP  Cardiovascular: S1 S2 auscultated, 1/6SEM,  Regular rate and rhythm.  Respiratory: Diminished breath sounds, no wheezing  Abdomen: Soft, obese, nontender, nondistended, + bowel sounds  Extremities: warm dry without cyanosis clubbing. 2+Edema LE B/L   Neuro: AAOx3, nonfocal  Psych: Normal affect and demeanor    Data Reviewed: I have personally reviewed following labs and imaging studies  CBC:  Recent Labs Lab 10/22/15 1430 10/23/15 0353 10/25/15 0507 10/27/15 0304  WBC 11.3* 10.1 10.5 8.8  NEUTROABS 6.7  --   --   --   HGB 13.4 13.2 12.3* 12.9*  HCT 42.2 43.1 39.2 42.9  MCV 91.7 93.9 90.7 95.3  PLT 273 263 236 223   Basic Metabolic Panel:  Recent Labs Lab 10/22/15 1430 10/23/15 0353 10/24/15 0536 10/25/15 0507 10/27/15 0304  NA 140 139 137 136 137  K 4.0 4.3 3.8 3.5 3.8  CL 105 101 96* 97* 90*  CO2 27 30 33* 32 37*  GLUCOSE 101* 150* 144* 133* 138*  BUN 13 13 14 15 11   CREATININE 1.06 1.21 1.03 0.92 0.85  CALCIUM 8.6* 8.8* 8.5* 8.2* 8.6*   GFR: Estimated Creatinine Clearance: 191.7 mL/min (by C-G formula based on SCr of 0.85 mg/dL). Liver Function Tests: No results for input(s): AST, ALT, ALKPHOS, BILITOT, PROT, ALBUMIN in the last 168 hours. No results for input(s): LIPASE, AMYLASE in the last 168 hours. No results for input(s): AMMONIA in the last 168 hours. Coagulation Profile: No results for input(s): INR, PROTIME in the last 168 hours. Cardiac Enzymes:  Recent Labs Lab 10/22/15 1430  TROPONINI <0.03   BNP (last 3 results) No results for input(s): PROBNP in the last 8760 hours. HbA1C: No results for input(s): HGBA1C in the last 72 hours. CBG: No results for input(s): GLUCAP in the last 168 hours. Lipid Profile: No results for input(s): CHOL, HDL, LDLCALC, TRIG, CHOLHDL, LDLDIRECT in the last 72 hours. Thyroid Function Tests: No results for input(s): TSH, T4TOTAL, FREET4, T3FREE, THYROIDAB in the last 72 hours. Anemia  Panel: No results for input(s): VITAMINB12, FOLATE, FERRITIN, TIBC, IRON, RETICCTPCT in the last 72 hours. Urine analysis:    Component Value Date/Time   COLORURINE YELLOW 10/23/2015 0925   APPEARANCEUR CLEAR 10/23/2015 0925   LABSPEC 1.013 10/23/2015 0925   PHURINE 5.5 10/23/2015 0925   GLUCOSEU NEGATIVE 10/23/2015 0925   HGBUR NEGATIVE 10/23/2015 0925   BILIRUBINUR NEGATIVE 10/23/2015 0925   KETONESUR NEGATIVE 10/23/2015 0925   PROTEINUR NEGATIVE 10/23/2015 0925   NITRITE NEGATIVE 10/23/2015 0925   LEUKOCYTESUR NEGATIVE 10/23/2015 0925   Sepsis Labs: @LABRCNTIP (procalcitonin:4,lacticidven:4)  ) Recent Results (from the past 240 hour(s))  MRSA PCR Screening     Status: None   Collection Time: 10/25/15  2:16 PM  Result Value Ref Range Status   MRSA by PCR NEGATIVE NEGATIVE Final    Comment:  The GeneXpert MRSA Assay (FDA approved for NASAL specimens only), is one component of a comprehensive MRSA colonization surveillance program. It is not intended to diagnose MRSA infection nor to guide or monitor treatment for MRSA infections.       Radiology Studies: No results found.   Scheduled Meds: . apixaban  5 mg Oral BID  . furosemide  60 mg Intravenous BID  . metoprolol succinate  50 mg Oral Daily  . sodium chloride flush  3 mL Intravenous Q12H   Continuous Infusions:    LOS: 5 days   Time Spent in minutes   30 minutes  Analysia Dungee D.O. on 10/27/2015 at 10:41 AM  Between 7am to 7pm - Pager - [phone]  After 7pm go to [email or web address] - password [email or web address]  And look for the night coverage person covering for me after hours  [facility] Office  [phone]

## 2015-10-27 NOTE — Progress Notes (Signed)
Patient HR 170-180s MD paged and Rapid response nurse called.EKG shows SVT.Patient denies CP but c/o "light headed" Metoprolol IV given per order. IV amiodarone bolus given followed by drip per order. Back to SR at 1837. EKG done Dr Sharyn LullHarwani updated.

## 2015-10-28 DIAGNOSIS — I471 Supraventricular tachycardia: Secondary | ICD-10-CM

## 2015-10-28 LAB — BASIC METABOLIC PANEL
Anion gap: 10 (ref 5–15)
BUN: 11 mg/dL (ref 6–20)
CHLORIDE: 90 mmol/L — AB (ref 101–111)
CO2: 36 mmol/L — ABNORMAL HIGH (ref 22–32)
Calcium: 8.5 mg/dL — ABNORMAL LOW (ref 8.9–10.3)
Creatinine, Ser: 0.9 mg/dL (ref 0.61–1.24)
GFR calc Af Amer: >60 mL/min (ref >60)
GFR calc non Af Amer: >60 mL/min (ref >60)
GLUCOSE: 128 mg/dL — AB (ref 65–99)
POTASSIUM: 3.4 mmol/L — AB (ref 3.5–5.1)
SODIUM: 136 mmol/L (ref 135–145)

## 2015-10-28 LAB — CBC
HCT: 40.3 % (ref 39.0–52.0)
HEMOGLOBIN: 11.8 g/dL — AB (ref 13.0–17.0)
MCH: 28.2 pg (ref 26.0–34.0)
MCHC: 29.3 g/dL — ABNORMAL LOW (ref 30.0–36.0)
MCV: 96.2 fL (ref 78.0–100.0)
Platelets: 223 10*3/uL (ref 150–400)
RBC: 4.19 MIL/uL — AB (ref 4.22–5.81)
RDW: 13.9 % (ref 11.5–15.5)
WBC: 8.9 10*3/uL (ref 4.0–10.5)

## 2015-10-28 MED ORDER — METOPROLOL SUCCINATE ER 50 MG PO TB24: 50.0000 mg | ORAL_TABLET | Freq: Every day | ORAL | 0 refills | Status: DC

## 2015-10-28 MED ORDER — APIXABAN 5 MG PO TABS
5.0000 mg | ORAL_TABLET | Freq: Two times a day (BID) | ORAL | 0 refills | Status: DC
Start: 2015-10-28 — End: 2023-12-07

## 2015-10-28 MED ORDER — POTASSIUM CHLORIDE CRYS ER 20 MEQ PO TBCR
40.0000 meq | EXTENDED_RELEASE_TABLET | Freq: Once | ORAL | Status: AC
  Administered 2015-10-28: 40 meq via ORAL
  Filled 2015-10-28: qty 2

## 2015-10-28 MED ORDER — AMIODARONE HCL 200 MG PO TABS
400.0000 mg | ORAL_TABLET | Freq: Two times a day (BID) | ORAL | Status: DC
  Administered 2015-10-28: 400 mg via ORAL
  Filled 2015-10-28: qty 2

## 2015-10-28 NOTE — Progress Notes (Signed)
Subjective:  Component noted patient had episode of SVT. Converted back to sinus rhythm with IV metoprolol and amiodarone. Discussed with patient at length regarding EP evaluation while in the hospital but wanted to leave understands the risk and benefits. States has very important business to take care of. Remains in sinus rhythm since yesterday evening  Objective:  Vital Signs in the last 24 hours: Temp:  [97.9 F (36.6 C)-98.8 F (37.1 C)] 98.7 F (37.1 C) (08/13 0752) Pulse Rate:  [63-182] 63 (08/13 0752) Resp:  [0-26] 14 (08/13 0752) BP: (83-114)/(22-78) 99/72 (08/13 0752) SpO2:  [89 %-98 %] 95 % (08/13 0752) FiO2 (%):  [2 %-80 %] 60 % (08/13 0504) Weight:  [469 lb 14.4 oz (213.1 kg)] 469 lb 14.4 oz (213.1 kg) (08/13 0504)  Intake/Output from previous day: 08/12 0701 - 08/13 0700 In: 1307.8 [P.O.:920; I.V.:387.8] Out: 4650 [Urine:4650] Intake/Output from this shift: Total I/O In: 644.5 [P.O.:600; I.V.:44.5] Out: -   Physical Exam: Exam unchanged  Lab Results:  Recent Labs  10/27/15 0304 10/28/15 0425  WBC 8.8 8.9  HGB 12.9* 11.8*  PLT 223 223    Recent Labs  10/27/15 0304 10/28/15 0425  NA 137 136  K 3.8 3.4*  CL 90* 90*  CO2 37* 36*  GLUCOSE 138* 128*  BUN 11 11  CREATININE 0.85 0.90   No results for input(s): TROPONINI in the last 72 hours.  Invalid input(s): CK, MB Hepatic Function Panel No results for input(s): PROT, ALBUMIN, AST, ALT, ALKPHOS, BILITOT, BILIDIR, IBILI in the last 72 hours. No results for input(s): CHOL in the last 72 hours. No results for input(s): PROTIME in the last 72 hours.  Imaging: Imaging results have been reviewed and No results found.  Cardiac Studies:  Assessment/Plan:  Status post SVT Status post Atrial flutter with 2 to 1block with chads vasc score of 2 Decompensated congestive heart failure secondary to preserved LV systolic function Mild coronary artery disease with distal diffuse LAD stenosis. New-onset  diabetes mellitus. Hyperlipidemia. Morbid obesity. Tobacco abuse. Obstructive sleep apnea/obesity hypoventilation syndrome. Metabolic alkalosis Plan Switch IV amiodarone to by mouth for short-term Patient wants to sign out AMA and will follow-up with EP as outpatient  LOS: 6 days    Jesse Hurst, Jesse Hurst 10/28/2015, 10:23 AM

## 2015-10-28 NOTE — Discharge Instructions (Signed)
Atrial Flutter °Atrial flutter is a heart rhythm that can cause the heart to beat very fast (tachycardia). It originates in the upper chambers of the heart (atria). In atrial flutter, the top chambers of the heart (atria) often beat much faster than the bottom chambers of the heart (ventricles). Atrial flutter has a regular "saw toothed" appearance in an EKG readout. An EKG is a test that records the electrical activity of the heart. Atrial flutter can cause the heart to beat up to 150 beats per minute (BPM). Atrial flutter can either be short lived (paroxysmal) or permanent.  °CAUSES  °Causes of atrial flutter can be many. Some of these include: °· Heart related issues: °¨ Heart attack (myocardial infarction). °¨ Heart failure. °¨ Heart valve problems. °¨ Poorly controlled high blood pressure (hypertension). °¨ After open heart surgery. °· Lung related issues: °¨ A blood clot in the lungs (pulmonary embolism). °¨ Chronic obstructive pulmonary disease (COPD). Medications used to treat COPD can attribute to atrial flutter. °· Other related causes: °¨ Hyperthyroidism. °¨ Caffeine. °¨ Some decongestant cold medications. °¨ Low electrolyte levels such as potassium or magnesium. °¨ Cocaine. °SYMPTOMS °· An awareness of your heart beating rapidly (palpitations). °· Shortness of breath. °· Chest pain. °· Low blood pressure (hypotension). °· Dizziness or fainting. °DIAGNOSIS  °Different tests can be performed to diagnose atrial flutter.  °· An EKG. °· Holter monitor. This is a 24-hour recording of your heart rhythm. You will also be given a diary. Write down all symptoms that you have and what you were doing at the time you experienced symptoms. °· Cardiac event monitor. This small device can be worn for up to 30 days. When you have heart symptoms, you will push a button on the device. This will then record your heart rhythm. °· Echocardiogram. This is an imaging test to look at your heart. Your caregiver will look at your  heart valves and the ventricles. °· Stress test. This test can help determine if the atrial flutter is related to exercise or if coronary artery disease is present. °· Laboratory studies will look at certain blood levels like: °¨ Complete blood count (CBC). °¨ Potassium. °¨ Magnesium. °¨ Thyroid function. °TREATMENT  °Treatment of atrial flutter varies. A combination of therapies may be used or sometimes atrial flutter may need only 1 type of treatment.  °Lab work: °If your blood work, such as your electrolytes (potassium, magnesium) or your thyroid function tests, are abnormal, your caregiver will treat them accordingly.  °Medication:  °There are several different types of medications that can convert your heart to a normal rhythm and prevent atrial flutter from reoccurring.  °Nonsurgical procedures: °Nonsurgical techniques may be used to control atrial flutter. Some examples include: °· Cardioversion. This technique uses either drugs or an electrical shock to restore a normal heart rhythm: °¨ Cardioversion drugs may be given through an intravenous (IV) line to help "reset" the heart rhythm. °¨ In electrical cardioversion, your caregiver shocks your heart with electrical energy. This helps to reset the heartbeat to a normal rhythm. °· Ablation. If atrial flutter is a persistent problem, an ablation may be needed. This procedure is done under mild sedation. High frequency radio-wave energy is used to destroy the area of heart tissue responsible for atrial flutter. °SEEK IMMEDIATE MEDICAL CARE IF:  °You have: °· Dizziness. °· Near fainting or fainting. °· Shortness of breath. °· Chest pain or pressure. °· Sudden nausea or vomiting. °· Profuse sweating. °If you have the above symptoms,   call your local emergency service immediately! Do not drive yourself to the hospital. °MAKE SURE YOU:  °· Understand these instructions. °· Will watch your condition. °· Will get help right away if you are not doing well or get worse. °   °This information is not intended to replace advice given to you by your health care provider. Make sure you discuss any questions you have with your health care provider. °  °Document Released: 07/20/2008 Document Revised: 03/24/2014 Document Reviewed: 09/15/2014 °Elsevier Interactive Patient Education ©2016 [employer] ° °

## 2015-10-28 NOTE — Progress Notes (Signed)
PROGRESS NOTE    Jesse Hurst  ZOX:[id number] DOB: 14-Oct-1960 DOA: 10/22/2015 PCP: [name], MD   Chief Complaint  Patient presents with  . Tachycardia    Brief Narrative:  55 y/o with h/o systolic CHF and an MI in the past who stopped taking his medications about 6 months after his MI, morbid obesity who presents for 2 wks of dyspnea, pedal edema and shortness of breath. He states he has been steadily gaining weight over the past 6 months. He has not seen a physician in nearly 2 yrs. 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 He is aware that he may have sleep apnea but has not thought about getting a sleep study. No recent chest pain or cough.   10/25/2015 - Patient was transferred to [facility] for cardioversion. Patient was sedated and converted to Normal sinus rhythm after administering 200 Joules of DC. Patient is maintained on IV Cardizem. Cardiology team is managing cardiac medication. Patient has been extubated and transferred to Step Down unit at [facility].  Assessment & Plan  Acute CHF (congestive heart failure) -Continue diuresis, Lasix 60 mg IV twice a day -CT negative for PE -Echocardiogram: EF 65-70% -Cardiology consulted and appreciated -Monitor intake and output, daily weights -UOP over past 24hours 4650cc -Weight is down about 15 pounds since admission -Patient still requiring 6L O2 while ambulating, O2 sats only up to 85% -Started on acetazolamide and losartan  SVT -Yesterday evening, patient had episode of SVT, with HR in 180s -Vagal maneuvers were attempted -Cardiology called and patient give IV lopressor and placed on amiodarone drip -Patient currently back in SR, HR 70s -Pending further recommendations from cardiology  A-flutter  -Initially placed on Cardizem drip -Cardiology consulted and appreciated -s/p Cardioversion. -Patient currently in sinus rhythm -Continue  metoprolol and Eliquis -Patient will need to follow-up with Dr. Graciela Husbands, EP, in 4-6 weeks  Elevated D dimer -CT negative for PE -Venous duplex negative DVT  Obesity, morbid -Upon discharge, patient will need follow-up with his primary care physician regarding last modifications including diet and exercise  CAD  -With diffuse LAD disease on cath in 2014- not on medications -Continue Eliquis and metoprolol  Tobacco abuse -Discussed smoking cessation -Declined nicotine patch  Dyslipidemia -Lipid panel: TC 123, triglycerides 92, HDL 24, LDL 81 -Currently not on statin -Patient should discuss lifestyle modifications including diet and exercise with his PCP upon discharge  Obstructive sleep apnea -CPAP when sleeping -Patient will need sleep apnea testing at discharge  Noncompliance  -Patient counseled on the need for compliance with doctor's appointments as well as medications -Patient continues to state he will leave against medical advice, as he wants to go home.  He states he has a Therapist, occupational and has an important meeting in the morning.  I explained to him that he is not medically stable for discharge, and that he had a very deadly heart rhythm yesterday.    DVT Prophylaxis  Eliquis  Code Status: Full  Family Communication: None at bedside  Disposition Plan: Admitted, continue diureses, continue amiodarone drip and to monitor in stepdown  Consultants Cardiology EP  Procedures  Echocardiogram TEE/DCCV  Antibiotics   Anti-infectives    None      Subjective:   Jesse Hurst seen and examined today.  Patient wants to go home and states he will discharge himself.  Denies chest pain, shortness of breath, abdominal pain, nausea or  vomiting, diarrhea or constipation. Feels he is breathing better and feels his swelling has improved.  Objective:   Vitals:   10/28/15 0021 10/28/15 0338 10/28/15 0504 10/28/15 0752  BP:   111/78 99/72  Pulse:  78 83  63  Resp:  13 (!) 21 14  Temp:   98.6 F (37 C) 98.7 F (37.1 C)  TempSrc:   Oral Oral  SpO2: 96% 98% 94% 95%  Weight:   (!) 213.1 kg (469 lb 14.4 oz)   Height:        Intake/Output Summary (Last 24 hours) at 10/28/15 1017 Last data filed at 10/28/15 0900  Gross per 24 hour  Intake          1592.37 ml  Output             3100 ml  Net         -1507.63 ml   Filed Weights   10/26/15 0500 10/27/15 0448 10/28/15 0504  Weight: (!) 216.3 kg (476 lb 14.4 oz) (!) 214.3 kg (472 lb 6.4 oz) (!) 213.1 kg (469 lb 14.4 oz)    Exam  General: Well developed, well nourished, NAD  HEENT: NCAT,mucous membranes moist. Currently on CPAP  Cardiovascular: S1 S2 auscultated, 1/6SEM,  Regular rate and rhythm.  Respiratory: Diminished breath sounds, no wheezing  Abdomen: Soft, obese, nontender, nondistended, + bowel sounds  Extremities: warm dry without cyanosis clubbing. 2+Edema LE B/L   Neuro: AAOx3, nonfocal  Psych: Appropriate mood and affect  Data Reviewed: I have personally reviewed following labs and imaging studies  CBC:  Recent Labs Lab 10/22/15 1430 10/23/15 0353 10/25/15 0507 10/27/15 0304 10/28/15 0425  WBC 11.3* 10.1 10.5 8.8 8.9  NEUTROABS 6.7  --   --   --   --   HGB 13.4 13.2 12.3* 12.9* 11.8*  HCT 42.2 43.1 39.2 42.9 40.3  MCV 91.7 93.9 90.7 95.3 96.2  PLT 273 263 236 223 223   Basic Metabolic Panel:  Recent Labs Lab 10/23/15 0353 10/24/15 0536 10/25/15 0507 10/27/15 0304 10/28/15 0425  NA 139 137 136 137 136  K 4.3 3.8 3.5 3.8 3.4*  CL 101 96* 97* 90* 90*  CO2 30 33* 32 37* 36*  GLUCOSE 150* 144* 133* 138* 128*  BUN 13 14 15 11 11   CREATININE 1.21 1.03 0.92 0.85 0.90  CALCIUM 8.8* 8.5* 8.2* 8.6* 8.5*   GFR: Estimated Creatinine Clearance: 180.4 mL/min (by C-G formula based on SCr of 0.9 mg/dL). Liver Function Tests: No results for input(s): AST, ALT, ALKPHOS, BILITOT, PROT, ALBUMIN in the last 168 hours. No results for input(s): LIPASE, AMYLASE  in the last 168 hours. No results for input(s): AMMONIA in the last 168 hours. Coagulation Profile: No results for input(s): INR, PROTIME in the last 168 hours. Cardiac Enzymes:  Recent Labs Lab 10/22/15 1430  TROPONINI <0.03   BNP (last 3 results) No results for input(s): PROBNP in the last 8760 hours. HbA1C: No results for input(s): HGBA1C in the last 72 hours. CBG: No results for input(s): GLUCAP in the last 168 hours. Lipid Profile: No results for input(s): CHOL, HDL, LDLCALC, TRIG, CHOLHDL, LDLDIRECT in the last 72 hours. Thyroid Function Tests: No results for input(s): TSH, T4TOTAL, FREET4, T3FREE, THYROIDAB in the last 72 hours. Anemia Panel: No results for input(s): VITAMINB12, FOLATE, FERRITIN, TIBC, IRON, RETICCTPCT in the last 72 hours. Urine analysis:    Component Value Date/Time   COLORURINE YELLOW 10/23/2015 0925   APPEARANCEUR CLEAR 10/23/2015 0925  LABSPEC 1.013 10/23/2015 0925   PHURINE 5.5 10/23/2015 0925   GLUCOSEU NEGATIVE 10/23/2015 0925   HGBUR NEGATIVE 10/23/2015 0925   BILIRUBINUR NEGATIVE 10/23/2015 0925   KETONESUR NEGATIVE 10/23/2015 0925   PROTEINUR NEGATIVE 10/23/2015 0925   NITRITE NEGATIVE 10/23/2015 0925   LEUKOCYTESUR NEGATIVE 10/23/2015 0925   Sepsis Labs: (procalcitonin:4,lacticidven:4)  ) Recent Results (from the past 240 hour(s))  MRSA PCR Screening     Status: None   Collection Time: 10/25/15  2:16 PM  Result Value Ref Range Status   MRSA by PCR NEGATIVE NEGATIVE Final    Comment:        The GeneXpert MRSA Assay (FDA approved for NASAL specimens only), is one component of a comprehensive MRSA colonization surveillance program. It is not intended to diagnose MRSA infection nor to guide or monitor treatment for MRSA infections.       Radiology Studies: No results found.   Scheduled Meds: . acetaZOLAMIDE  250 mg Oral BID  . apixaban  5 mg Oral BID  . furosemide  60 mg Intravenous BID  . losartan  25 mg  Oral Daily  . metoprolol succinate  50 mg Oral Daily  . sodium chloride flush  3 mL Intravenous Q12H   Continuous Infusions: . amiodarone 30 mg/hr (10/28/15 0520)     LOS: 6 days   Time Spent in minutes   30 minutes  Nabria Nevin D.O. on 10/28/2015 at 10:17 AM  Between 7am to 7pm - Pager - [phone]  After 7pm go to [email or web address] - password [email or web address]  And look for the night coverage person covering for me after hours  [facility] Office  [phone]

## 2015-10-28 NOTE — Progress Notes (Signed)
Patient wants to leave the hospital against medical advice. AMA signed at 1232 hrs and witnessed by myself.

## 2015-10-29 ENCOUNTER — Telehealth: Payer: Self-pay | Admitting: Family Medicine

## 2015-10-29 NOTE — Telephone Encounter (Signed)
Transition Care Management Follow-up Telephone Call   Date discharged? 10/28/2015   How have you been since you were released from the hospital? Patient states he's been okay!   Do you understand why you were in the hospital? yes   Do you understand the discharge instructions? yes   Where were you discharged to? Home   Items Reviewed:  Medications reviewed: yes  Allergies reviewed: yes  Dietary changes reviewed: yes  Referrals reviewed: no   Functional Questionnaire:   Activities of Daily Living (ADLs):   He states they are independent in the following: none States they require assistance with the following: none   Any transportation issues/concerns?: no   Any patient concerns? no   Confirmed importance and date/time of follow-up visits scheduled yes  Provider Appointment booked with Dr Milinda CaveMcGowen Friday 11/02/15.  Confirmed with patient if condition begins to worsen call PCP or go to the ER.  Patient was given the office number and encouraged to call back with question or concerns.  : yes

## 2015-10-29 NOTE — Telephone Encounter (Signed)
LMOM for pt to return call to complete TCM.

## 2015-10-30 ENCOUNTER — Encounter (HOSPITAL_COMMUNITY): Payer: Self-pay | Admitting: Cardiovascular Disease

## 2015-10-30 NOTE — Telephone Encounter (Signed)
LMOM for pt to CB to complete TCM.  Spoke to patient's wife and she gave me his office number @ [phone].

## 2015-10-30 NOTE — Addendum Note (Signed)
Addendum  created 10/30/15 1309 by Shelton SilvasKevin D Nyaira Hodgens, MD   SmartForm saved

## 2015-10-31 ENCOUNTER — Encounter (HOSPITAL_BASED_OUTPATIENT_CLINIC_OR_DEPARTMENT_OTHER): Payer: Self-pay

## 2015-11-02 ENCOUNTER — Ambulatory Visit (INDEPENDENT_AMBULATORY_CARE_PROVIDER_SITE_OTHER): Payer: BLUE CROSS/BLUE SHIELD | Admitting: Family Medicine

## 2015-11-02 ENCOUNTER — Encounter: Payer: Self-pay | Admitting: Family Medicine

## 2015-11-02 VITALS — BP 145/84 | HR 93 | Temp 98.2°F | Resp 16 | Ht 73.0 in | Wt >= 6400 oz

## 2015-11-02 DIAGNOSIS — I5023 Acute on chronic systolic (congestive) heart failure: Secondary | ICD-10-CM

## 2015-11-02 DIAGNOSIS — I4892 Unspecified atrial flutter: Secondary | ICD-10-CM | POA: Diagnosis not present

## 2015-11-02 DIAGNOSIS — F17200 Nicotine dependence, unspecified, uncomplicated: Secondary | ICD-10-CM

## 2015-11-02 DIAGNOSIS — I5030 Unspecified diastolic (congestive) heart failure: Secondary | ICD-10-CM | POA: Diagnosis not present

## 2015-11-02 DIAGNOSIS — Z9989 Dependence on other enabling machines and devices: Secondary | ICD-10-CM

## 2015-11-02 DIAGNOSIS — I471 Supraventricular tachycardia, unspecified: Secondary | ICD-10-CM

## 2015-11-02 DIAGNOSIS — G4733 Obstructive sleep apnea (adult) (pediatric): Secondary | ICD-10-CM

## 2015-11-02 LAB — BASIC METABOLIC PANEL
BUN: 13 mg/dL (ref 6–23)
CHLORIDE: 101 meq/L (ref 96–112)
CO2: 35 meq/L — AB (ref 19–32)
Calcium: 9.3 mg/dL (ref 8.4–10.5)
Creatinine, Ser: 0.98 mg/dL (ref 0.40–1.50)
GFR: 84.41 mL/min (ref >60.00)
GLUCOSE: 150 mg/dL — AB (ref 70–99)
POTASSIUM: 4.5 meq/L (ref 3.5–5.1)
SODIUM: 143 meq/L (ref 135–145)

## 2015-11-02 NOTE — Progress Notes (Signed)
11/02/2015  CC:  Chief Complaint  Patient presents with  . Hospitalization Follow-up    TCM    Patient is a 55 y.o. Caucasian male who presents accompanied by his wife for hospital follow up, specifically [facility] face-to-face visit. Dates hospitalized: 8/7-8/13, 2017. Days since d/c from hospital:  5 Patient was discharged from hospital to [facility] Reason for admission to hospital: SOB and LE swelling. Discharge diagnoses: acute on chronic systolic HF, A-flutter/SVT. He was diuresed, he was electrically cardioverted while in hospital, and he was also started on metoprolol and eliquis.   Pt had not been taking his meds for 110mo prior to getting admitted. Date of interactive (phone) contact with patient and/or caregiver: 10/29/15  I have reviewed patient's discharge summary plus pertinent specific notes, labs, and imaging from the hospitalization.    Feels good since getting out of the hospital.  Breathing is good.  He says he had hosp f/u with Dr. Sharyn LullHarwani 4 days ago--he was told to f/u again in 2 mo.  I am unable to see any hospital weights, but his wife says he weighed about 480 lbs upon admission and right before d/c home he was about 455 lbs--like today's weight. He denies palpitations or sensation of racing heart.  Denies dizziness or CP.  He uses CPAP machine and feels like he is sleeping well with 4L oxygen with CPAP setting at 5. He has appt 12/17/15 to be further evaluated for his sleep apnea (he bought the CPAP prior to getting a sleep study).  He does still smoke and says he is ready to quit.  Medication reconciliation was done today and patient is  taking meds as recommended by discharging hospitalist/specialist.    PMH:  Past Medical History:  Diagnosis Date  . Acute coronary syndrome (HCC) 02/22/2013   Small MI vs demand ischemia in the setting of PSVT  . History of PSVT (paroxysmal supraventricular tachycardia)   . Obesity, morbid (HCC) 12/21/2012   . Tobacco dependence in remission 12/21/2012    PSH:  Past Surgical History:  Procedure Laterality Date  . CARDIAC CATHETERIZATION  02/2013   Diffuse distal LAD dz, no intervention  . CARDIOVERSION N/A 10/25/2015   Procedure: CARDIOVERSION;  Surgeon: Orpah CobbAjay Kadakia, MD;  Location: [facility] ENDOSCOPY;  Service: Cardiovascular;  Laterality: N/A;  . LEFT HEART CATHETERIZATION WITH CORONARY ANGIOGRAM N/A 02/24/2013   Procedure: LEFT HEART CATHETERIZATION WITH CORONARY ANGIOGRAM;  Surgeon: Robynn PaneMohan N Harwani, MD;  Location: [facility] CATH LAB;  Service: Cardiovascular;  Laterality: N/A;  . TEE WITHOUT CARDIOVERSION N/A 10/25/2015   Procedure: TRANSESOPHAGEAL ECHOCARDIOGRAM (TEE);  Surgeon: Orpah CobbAjay Kadakia, MD;  [facility];  Service: Cardiovascular;  Laterality: N/A;  . TRANSTHORACIC ECHOCARDIOGRAM  02/2013; 10/2015   2014: LVH, EF 45-50 %.  2017: EF 45-50%.  Hypokinesis of the anteroseptal myocardium.  Moderate mitral regurg.    MEDS:  Outpatient Medications Prior to Visit  Medication Sig Dispense Refill  . apixaban (ELIQUIS) 5 MG TABS tablet Take 1 tablet (5 mg total) by mouth 2 (two) times daily. 60 tablet 0  . metoprolol succinate (TOPROL-XL) 50 MG 24 hr tablet Take 1 tablet (50 mg total) by mouth daily. 30 tablet 0   No facility-administered medications prior to visit.   Furosemide 80 mg po qd KDUR 20 mEQ: 1 tab po qd  EXAM:  Gen: alert, well-appearing, morbidly obese WM in NAD. AFFECT: pleasant, lucid thought and speech. UJW:JXBJENT:Eyes: no injection, icteris, swelling, or exudate.  EOMI, PERRLA. Mouth: lips without  lesion/swelling.  Oral mucosa pink and moist. Oropharynx without erythema, exudate, or swelling.  CV: RRR, distant S1 and S2, no audible m/r/g. Chest is clear, no wheezing or rales. Normal symmetric air entry throughout both lung fields. No chest wall deformities or tenderness. EXT: 2+ pitting edema in both LL's.  No clubbing or cyanosis.   Pertinent labs/imaging Reviewed last  hospital day labs: electrolytes, Cr, CBC. Reviewed echo results.  Reviewed CXR and chest CT angio from hospitalization.   ASSESSMENT/PLAN:  1) Acute on chronic systolic CHF: much improved after huge diuresis in hospital, doing well since d/c home. Compliant with meds.  Will check BMET today to make sure K and Cr are stable. Low Na diet emphasized.  2) Cardiac dysrhythmia: atrial flutter was cardioverted in hospital.  From looking at records it looks like he then had a run of SVT in hosp and was put on an amiodarone drip.  This med was not continued out of hosp b/c of fear of pt's tendency to be noncompliant with f/u monitoring, etc.  He has been set up to be seen by Dr. Graciela HusbandsKlein in EP on 12/26/15.  He does have a prior hx of PSVT.  3) OSA, currently on CPAP.  He needs sleep study done to confirm dx and determine appropriate settings--[facility].  4) Tobacco dependence: pt said "i can quit anytime I want", so I told him to quit now.  He said ok.  5) Morbid obesity: he'll need to really work on TLC. Lipids in hosp were fine except low HDL.  Medical decision making of moderate complexity was utilized today.  [id number]  An After Visit Summary was printed and given to the patient.  FOLLOW UP:  4 weeks--primarily to ensure/document compliance, monitor wt, etc.  Signed:  Santiago BumpersPhil Kainoah Bartosiewicz, MD           11/02/2015

## 2015-11-02 NOTE — Progress Notes (Signed)
Pre visit review using our clinic review tool, if applicable. No additional management support is needed unless otherwise documented below in the visit note. 

## 2015-11-08 ENCOUNTER — Ambulatory Visit: Payer: BLUE CROSS/BLUE SHIELD | Admitting: Family Medicine

## 2015-11-16 ENCOUNTER — Encounter: Payer: Self-pay | Admitting: Physician Assistant

## 2015-11-30 ENCOUNTER — Ambulatory Visit (INDEPENDENT_AMBULATORY_CARE_PROVIDER_SITE_OTHER): Payer: BLUE CROSS/BLUE SHIELD | Admitting: Family Medicine

## 2015-11-30 ENCOUNTER — Encounter: Payer: Self-pay | Admitting: Family Medicine

## 2015-11-30 VITALS — BP 134/86 | HR 93 | Temp 97.8°F | Resp 18 | Wt >= 6400 oz

## 2015-11-30 DIAGNOSIS — Z23 Encounter for immunization: Secondary | ICD-10-CM

## 2015-11-30 DIAGNOSIS — I5022 Chronic systolic (congestive) heart failure: Secondary | ICD-10-CM | POA: Diagnosis not present

## 2015-11-30 DIAGNOSIS — Z0000 Encounter for general adult medical examination without abnormal findings: Secondary | ICD-10-CM

## 2015-11-30 DIAGNOSIS — F17200 Nicotine dependence, unspecified, uncomplicated: Secondary | ICD-10-CM | POA: Diagnosis not present

## 2015-11-30 NOTE — Progress Notes (Signed)
Pre visit review using our clinic review tool, if applicable. No additional management support is needed unless otherwise documented below in the visit note. 

## 2015-11-30 NOTE — Progress Notes (Signed)
OFFICE VISIT  11/30/2015   CC:  Chief Complaint  Patient presents with  . Follow-up    CHF   HPI:    Patient is a 55 y.o. Caucasian male who presents for 1 mo f/u chronic systolic HF. Wt down 7 lbs since last visit.  Feeling good.  Compliant with low Na diet and his daily meds. He is walking some daily, which is better than prior.   Says he feels well.    Has f/u with Dr. Graciela HusbandsKlein next week. F/u with Dr. Sharyn LullHarwani middle of next month.  He still smokes but cut back by 30%, wants to continue cutting back.  He declines med help with this.  ROS: no CP, palpitations, racing heart, dizziness, or HAs. No bleeding or easy bruising.  Past Medical History:  Diagnosis Date  . Acute coronary syndrome (HCC) 02/22/2013   Small MI vs demand ischemia in the setting of PSVT  . Chronic systolic CHF (congestive heart failure) (HCC)   . History of atrial flutter 10/2015  . History of PSVT (paroxysmal supraventricular tachycardia)   . Obesity, morbid (HCC) 12/21/2012  . OSA on CPAP 2017   Sleep study set for 12/17/15  . Tobacco dependence 10/2015    Past Surgical History:  Procedure Laterality Date  . CARDIAC CATHETERIZATION  02/2013   Diffuse distal LAD dz, no intervention  . CARDIOVERSION N/A 10/25/2015   Procedure: CARDIOVERSION;  Surgeon: Orpah CobbAjay Kadakia, MD;  Location: [facility] ENDOSCOPY;  Service: Cardiovascular;  Laterality: N/A;  . LEFT HEART CATHETERIZATION WITH CORONARY ANGIOGRAM N/A 02/24/2013   Procedure: LEFT HEART CATHETERIZATION WITH CORONARY ANGIOGRAM;  Surgeon: Robynn PaneMohan N Harwani, MD;  Location: [facility] CATH LAB;  Service: Cardiovascular;  Laterality: N/A;  . TEE WITHOUT CARDIOVERSION N/A 10/25/2015   Procedure: TRANSESOPHAGEAL ECHOCARDIOGRAM (TEE);  Surgeon: Orpah CobbAjay Kadakia, MD;  [facility];  Service: Cardiovascular;  Laterality: N/A;  . TRANSTHORACIC ECHOCARDIOGRAM  02/2013; 10/2015   2014: LVH, EF 45-50 %.  2017: EF 45-50%.  Hypokinesis of the anteroseptal myocardium.  Moderate mitral  regurg.    Outpatient Medications Prior to Visit  Medication Sig Dispense Refill  . apixaban (ELIQUIS) 5 MG TABS tablet Take 1 tablet (5 mg total) by mouth 2 (two) times daily. 60 tablet 0  . furosemide (LASIX) 80 MG tablet Take 80 mg by mouth daily.  3  . metoprolol succinate (TOPROL-XL) 50 MG 24 hr tablet Take 1 tablet (50 mg total) by mouth daily. 30 tablet 0  . potassium chloride SA (K-DUR,KLOR-CON) 20 MEQ tablet Take 20 mEq by mouth daily.  3   No facility-administered medications prior to visit.     No Known Allergies  ROS As per HPI  PE: Blood pressure 134/86, pulse 93, temperature 97.8 F (36.6 C), temperature source Oral, resp. rate 18, weight (!) 447 lb 12.8 oz (203.1 kg), SpO2 94 %. Gen: Alert, well appearing.  Patient is oriented to person, place, time, and situation. AFFECT: pleasant, lucid thought and speech. CV: RRR, distant S1 and S2 due to body habitus.  No audible m/r/g. Chest is clear, no wheezing or rales. Normal symmetric air entry throughout both lung fields. No chest wall deformities or tenderness. EXT: trace R LL pitting edema, 1+ L LL pitting edema  LABS:  Lab Results  Component Value Date   TSH 1.462 10/22/2015   Lab Results  Component Value Date   WBC 8.9 10/28/2015   HGB 11.8 (L) 10/28/2015   HCT 40.3 10/28/2015   MCV 96.2 10/28/2015  PLT 223 10/28/2015   Lab Results  Component Value Date   CREATININE 0.98 11/02/2015   BUN 13 11/02/2015   NA 143 11/02/2015   K 4.5 11/02/2015   CL 101 11/02/2015   CO2 35 (H) 11/02/2015   Lab Results  Component Value Date   ALT 18 02/23/2013   AST 20 02/23/2013   ALKPHOS 71 02/23/2013   BILITOT 0.5 02/23/2013   Lab Results  Component Value Date   CHOL 123 10/23/2015   Lab Results  Component Value Date   HDL 24 (L) 10/23/2015   Lab Results  Component Value Date   LDLCALC 81 10/23/2015   Lab Results  Component Value Date   TRIG 92 10/23/2015   Lab Results  Component Value Date    CHOLHDL 5.1 10/23/2015   IMPRESSION AND PLAN:  1) Chronic systolic CHF: stable.  The current medical regimen is effective;  continue present plan and medications. He remains compliant with diet and meds. Encouraged complete smoking cessation--pt declines medication aid/assistance for this.  2) Atrial flutter: s/p cardioversion in hosp. Continue eliquis, f/u with Dr. Graciela Husbands.  3) Preventative health care: flu vaccine given today.  An After Visit Summary was printed and given to the patient.  FOLLOW UP: Return in about 3 months (around 02/29/2016) for routine chronic illness f/u.  Signed:  Santiago Bumpers, MD           11/30/2015

## 2015-11-30 NOTE — Addendum Note (Signed)
Addended by: Thomasena EdisWILLIAMS, Elois Averitt N on: 11/30/2015 03:33 PM   Modules accepted: Orders

## 2015-12-04 ENCOUNTER — Encounter: Payer: Self-pay | Admitting: Physician Assistant

## 2015-12-04 ENCOUNTER — Ambulatory Visit (INDEPENDENT_AMBULATORY_CARE_PROVIDER_SITE_OTHER): Payer: BLUE CROSS/BLUE SHIELD | Admitting: Physician Assistant

## 2015-12-04 VITALS — BP 114/78 | HR 90 | Ht 73.0 in | Wt >= 6400 oz

## 2015-12-04 DIAGNOSIS — I483 Typical atrial flutter: Secondary | ICD-10-CM

## 2015-12-04 DIAGNOSIS — I509 Heart failure, unspecified: Secondary | ICD-10-CM

## 2015-12-04 DIAGNOSIS — I471 Supraventricular tachycardia: Secondary | ICD-10-CM | POA: Diagnosis not present

## 2015-12-04 NOTE — Progress Notes (Signed)
Cardiology Office Note Date:  12/04/2015  Patient ID:  Jesse Hurst, DOB [birth year], MRN [id number] PCP:  [name], MD  Cardiologist:  Dr. Sharyn Lull Electrophysiologist: Dr. Graciela Husbands   Chief Complaint: hospital f/u  History of Present Illness: Jesse Hurst is a 55 y.o. male with history of CAD by notes diffuse LAD disease in 2014, morbid obesity, tobacco use, OSA/hypoventilatory syndrome, non-compliance notes state that he had been off all his cardiac medicine for about 6 months, sought attention at [facility] with increased SOB, fatigue, and LE swelling, admitted with dx new AFlutter with RVR 2:1 conduction, acute CHF exacerbation on 10/22/15.  He was initially treated with IV amiodarone without significant change in heart rate, received 2 doses of IV Lopressor without any effect, cardizem gtt and Eliquis, the amio gtt stopped.  Despite this his HR remained elevated and was decided to transfer to [facility] today for TEE/DCCV. The patient underwent TEE/DCCV successful restoration of SR, required ETT intubation, though was able to be extubated.  Discharge summary notes the patient left AMA, the evening prior notes state he had SVT that ultimately treated with IV amiodarone  He was seen by Dr. Graciela Husbands, ablation felt to be the appropriate tx though his weight, sleep apnea were concerns, and need for a/c pre-procedure needed withplans to f/u out patinet  The patient comes in today to be seen for Dr. Graciela Husbands, he is accompanied by his wife and daughter.  He states that he is feeling absolutely great!  He is wearing his CPAP and has much better daytime energy, not falling asleep during the day or nodding off to sleep at all, he is walking for exercise and motivated to continue his weight loss, losing 7 more pounds since out of the hospital.  He is working on stopping smoking, reports he is cutting back.  He denies any kind of CP, no palpitations, or SOB, no near syncope or syncope./  He is taking the Eliquis without  missing doses or bleeding.    Past Medical History:  Diagnosis Date  . Acute coronary syndrome (HCC) 02/22/2013   Small MI vs demand ischemia in the setting of PSVT  . Chronic systolic CHF (congestive heart failure) (HCC)   . History of atrial flutter 10/2015  . History of PSVT (paroxysmal supraventricular tachycardia)   . Obesity, morbid (HCC) 12/21/2012  . OSA on CPAP 2017   Sleep study set for 12/17/15  . Tobacco dependence 10/2015    Past Surgical History:  Procedure Laterality Date  . CARDIAC CATHETERIZATION  02/2013   Diffuse distal LAD dz, no intervention  . CARDIOVERSION N/A 10/25/2015   Procedure: CARDIOVERSION;  Surgeon: Orpah Cobb, MD;  Location: [facility] ENDOSCOPY;  Service: Cardiovascular;  Laterality: N/A;  . LEFT HEART CATHETERIZATION WITH CORONARY ANGIOGRAM N/A 02/24/2013   Procedure: LEFT HEART CATHETERIZATION WITH CORONARY ANGIOGRAM;  Surgeon: Robynn Pane, MD;  Location: [facility] CATH LAB;  Service: Cardiovascular;  Laterality: N/A;  . TEE WITHOUT CARDIOVERSION N/A 10/25/2015   Procedure: TRANSESOPHAGEAL ECHOCARDIOGRAM (TEE);  Surgeon: Orpah Cobb, MD;  Location: [facility] ENDOSCOPY;  Service: Cardiovascular;  Laterality: N/A;  . TRANSTHORACIC ECHOCARDIOGRAM  02/2013; 10/2015   2014: LVH, EF 45-50 %.  2017: EF 45-50%.  Hypokinesis of the anteroseptal myocardium.  Moderate mitral regurg.    Current Outpatient Prescriptions  Medication Sig Dispense Refill  . apixaban (ELIQUIS) 5 MG TABS tablet Take 1 tablet (5 mg total) by mouth 2 (two) times daily. 60 tablet 0  . furosemide (LASIX) 80 MG tablet Take  80 mg by mouth daily.  3  . metoprolol succinate (TOPROL-XL) 50 MG 24 hr tablet Take 1 tablet (50 mg total) by mouth daily. 30 tablet 0  . potassium chloride SA (K-DUR,KLOR-CON) 20 MEQ tablet Take 20 mEq by mouth daily.  3   No current facility-administered medications for this visit.     Allergies:   Review of patient's allergies indicates no known allergies.   Social History:   The patient  reports that he quit smoking about 2 years ago. His smoking use included Cigarettes. He has a 30.00 pack-year smoking history. He has never used smokeless tobacco. He reports that he does not drink alcohol or use drugs.   Family History:  The patient's family history includes Cancer in his father and mother; Drug abuse in his son.  ROS:  Please see the history of present illness.    All other systems are reviewed and otherwise negative.   PHYSICAL EXAM:  VS:  BP 114/78   Pulse 90   Ht 6\' 1"  (1.854 m)   Wt (!) 447 lb (202.8 kg)   BMI 58.97 kg/m  BMI: Body mass index is 58.97 kg/m. Well nourished, well developed, in no acute distress  HEENT: normocephalic, atraumatic  Neck: no JVD, carotid bruits or masses Cardiac:  RRR; no significant murmurs, no rubs, or gallops Lungs:  clear to auscultation bilaterally, no wheezing, rhonchi or rales  Abd: soft, nontender MS: no deformity or atrophy Ext: 1+ edema (reported as chronic and at his baseline) Skin: warm and dry, no rash Neuro:  No gross deficits appreciated Psych: euthymic mood, full affect     EKG:   10/24/15 AFlutter (typical) 2:1 conduction V rate 144 10/25/15: SR, 70bpm 10/27/15: SVT 183bpm  10/23/15 TTE Study Conclusions - Procedure narrative: Transthoracic echocardiography. Image   quality was poor. The study was technically difficult, as a   result of body habitus. Intravenous contrast (Definity) was   administered. - Left ventricle: The cavity size was normal. Systolic function was   vigorous. The estimated ejection fraction was in the range of 65%   to 70%. Impressions: - Very technically difficult study due to body habitus. Definity   contrast images were performed. There is grossly hyperdynamic LV   function wtih EF around 70%. The study is inadequate to exclude   wall motion abnormalities or for valvular evaulation. Consider   TEE for better image quality if necessary.  10/25/15 TEE Study  Conclusions - Left ventricle: Systolic function was mildly reduced. The estimated ejection fraction was in the range of 45% to 50%. Hypokinesis of the anteroseptal myocardium. - Mitral valve: There was moderate regurgitation, with multiple jets directed centrally. - Left atrium: The atrium was moderately dilated. No evidence of thrombus in the atrial cavity or appendage. - Right atrium: The atrium was mildly dilated. No evidence of thrombus in the atrial cavity or appendage. - Atrial septum: No defect or patent foramen ovale was identified. - Superior vena cava: The study excluded a thrombus.  02/25/13: [facility] FINDINGS: LV showed good LV systolic function. EF of approximately 50- 55%. Left main was patent. LAD was patent in proximal and mid portion and then was diffusely diseased distally and at the apex, LAD, and before reaching the apex. Diagonal 1 and 2 were very small which were patent. Ramus was very small, diffusely diseased. Left circumflex was patent proximally and then tapers down in AV groove. OM1 is very large which is patent. OM2 is very small which is patent.  RCA is large which is patent. PDA and PLV branches were patent.   Recent Labs: 10/22/2015: B Natriuretic Peptide 72.8; TSH 1.462 10/28/2015: Hemoglobin 11.8; Platelets 223 11/02/2015: BUN 13; Creatinine, Ser 0.98; Potassium 4.5; Sodium 143  10/23/2015: Cholesterol 123; HDL 24; LDL Cholesterol 81; Total CHOL/HDL Ratio 5.1; Triglycerides 92; VLDL 18   CrCl cannot be calculated (Patient's most recent lab result is older than the maximum 21 days allowed.).   Wt Readings from Last 3 Encounters:  12/04/15 (!) 447 lb (202.8 kg)  11/30/15 (!) 447 lb 12.8 oz (203.1 kg)  11/02/15 (!) 454 lb 4 oz (206 kg)     Other studies reviewed: Additional studies/records reviewed today include: summarized above  ASSESSMENT AND PLAN:  1. New AFlutter with RVR     CHA2DS2Vasc is at least 1 with CAD and 2 with CM by  TEE EF 45-50% (better on the TTE), on Eliquis 2. SVT  In the hospital Dr. Graciela HusbandsKlein discussed risk/benefit of EPS/ablation procedure, re-discussed here again today.   We also discussed on ging life style adjustments and measures as well, continued weight loss, exercise, smoking cessation, nightly use of his CPAP, and avoidance of stimulants.  At this time, he would like to give it some thought and should he decide to pursue procedure he will give us a call.       3. Morbid obesity, OSA, mention of hypoventilatory syndrome The patient states he drifts to sleep easily, even when driving and is instructed not to drive until his sleep apnea is formally evaluated and treated.     S/p brief intubation with TEE/DCCV Reports compliance with CPAP post hospitalization  4. CHF likely secondary to his RVR    No symptoms, weight is down   Disposition: F/u with Dr. Sharyn LullHarwani, he will call us should he decide to pursue EPA/ablation procedure.  Current medicines are reviewed at length with the patient today.  The patient did not have any concerns regarding medicines.  Judith BlonderSigned, Renee Ursy, PA-C 12/04/2015 2:52 PM     [facility] [address] [place] [address] [phone] (office)  [phone] (fax)

## 2015-12-04 NOTE — Patient Instructions (Signed)
Medication Instructions:   NONE ORDER TODAY   If you need a refill on your cardiac medications before your next appointment, please call your pharmacy.  Labwork: NONE ORDER TODAY   Testing/Procedures: NONE ORDER TODAY    Follow-Up:  FOLLOW UP DR HARWANI    CONTACT [facility] [phone] AS NEEDED FOR  ANY CARDIAC RELATED SYMPTOMS    Any Other Special Instructions Will Be Listed Below (If Applicable).

## 2015-12-05 ENCOUNTER — Emergency Department (HOSPITAL_COMMUNITY)
Admission: EM | Admit: 2015-12-05 | Discharge: 2015-12-05 | Disposition: A | Discharge status: Home / Self Care | Payer: BLUE CROSS/BLUE SHIELD | Attending: Emergency Medicine | Admitting: Emergency Medicine

## 2015-12-05 ENCOUNTER — Encounter (HOSPITAL_COMMUNITY): Payer: Self-pay

## 2015-12-05 DIAGNOSIS — Z7901 Long term (current) use of anticoagulants: Secondary | ICD-10-CM | POA: Diagnosis not present

## 2015-12-05 DIAGNOSIS — Z87891 Personal history of nicotine dependence: Secondary | ICD-10-CM | POA: Diagnosis not present

## 2015-12-05 DIAGNOSIS — Z79899 Other long term (current) drug therapy: Secondary | ICD-10-CM | POA: Insufficient documentation

## 2015-12-05 DIAGNOSIS — I110 Hypertensive heart disease with heart failure: Secondary | ICD-10-CM | POA: Insufficient documentation

## 2015-12-05 DIAGNOSIS — I471 Supraventricular tachycardia: Secondary | ICD-10-CM | POA: Diagnosis not present

## 2015-12-05 DIAGNOSIS — I5022 Chronic systolic (congestive) heart failure: Secondary | ICD-10-CM | POA: Diagnosis not present

## 2015-12-05 DIAGNOSIS — R000 Tachycardia, unspecified: Secondary | ICD-10-CM | POA: Diagnosis present

## 2015-12-05 NOTE — ED Triage Notes (Signed)
Pt. Coming from home via [facility] for SVT. Pt. Arguing and angry when he had sudden onset of palpitations. Pt. Noted to be in SVT at rate of 200-220.Marland Kitchen. Pt. Given 6mg  adenosine and then 12 adenosine, which converted him back to 110 sinus tachycardia. During episode there was no palpable BP and a weak radial pulse per EMS. Pt. Denies chest pain. Pt. Hx of CHF and MI. Pt. Discussed with cardiology an ablation soon. Pt. Also given 750mL saline en route.

## 2015-12-05 NOTE — ED Provider Notes (Signed)
[facility]-EMERGENCY DEPT Provider Note   CSN: [id number] Arrival date & time: 12/05/15  1854   History   Chief Complaint Chief Complaint  Patient presents with  . Tachycardia    HPI Jesse Hurst is a 55 y.o. male.  HPI Patient is a 55 year old male with a past medical history of ACS, CHF, atrial flutter OSA on CPAP, and obesity who comes in today complaining of an episode which occurred earlier at approximately 3 PM when the patient was on the telephone and became angered.  Patient had sudden onset of diaphoresis lightheadedness and dizziness.  Patient denies chest pain or shortness of breath.  Patient denies fevers chills nausea or vomiting. Past Medical History:  Diagnosis Date  . Acute coronary syndrome (HCC) 02/22/2013   Small MI vs demand ischemia in the setting of PSVT  . Chronic systolic CHF (congestive heart failure) (HCC)   . History of atrial flutter 10/2015  . History of PSVT (paroxysmal supraventricular tachycardia)   . Obesity, morbid (HCC) 12/21/2012  . OSA on CPAP 2017   Sleep study set for 12/17/15  . Tobacco dependence 10/2015    Patient Active Problem List   Diagnosis Date Noted  . Uncontrolled atrial flutter (HCC) 10/25/2015  . Acute CHF (congestive heart failure) (HCC) 10/22/2015  . Dyslipidemia 10/22/2015  . Essential hypertension 10/22/2015  . Acute coronary syndrome (HCC) 02/23/2013  . Obesity, morbid (HCC) 12/21/2012  . Tobacco dependence 12/21/2012    Past Surgical History:  Procedure Laterality Date  . CARDIAC CATHETERIZATION  02/2013   Diffuse distal LAD dz, no intervention  . CARDIOVERSION N/A 10/25/2015   Procedure: CARDIOVERSION;  Surgeon: Orpah CobbAjay Kadakia, MD;  Location: [facility] ENDOSCOPY;  Service: Cardiovascular;  Laterality: N/A;  . LEFT HEART CATHETERIZATION WITH CORONARY ANGIOGRAM N/A 02/24/2013   Procedure: LEFT HEART CATHETERIZATION WITH CORONARY ANGIOGRAM;  Surgeon: Robynn PaneMohan N Harwani, MD;  Location: [facility] CATH LAB;  Service: Cardiovascular;   Laterality: N/A;  . TEE WITHOUT CARDIOVERSION N/A 10/25/2015   Procedure: TRANSESOPHAGEAL ECHOCARDIOGRAM (TEE);  Surgeon: Orpah CobbAjay Kadakia, MD;  [facility];  Service: Cardiovascular;  Laterality: N/A;  . TRANSTHORACIC ECHOCARDIOGRAM  02/2013; 10/2015   2014: LVH, EF 45-50 %.  2017: EF 45-50%.  Hypokinesis of the anteroseptal myocardium.  Moderate mitral regurg.       Home Medications    Prior to Admission medications   Medication Sig Start Date End Date Taking? Authorizing Provider  apixaban (ELIQUIS) 5 MG TABS tablet Take 1 tablet (5 mg total) by mouth 2 (two) times daily. 10/28/15   Maryann Mikhail, DO  furosemide (LASIX) 80 MG tablet Take 80 mg by mouth daily. 10/29/15   Historical Provider, MD  metoprolol succinate (TOPROL-XL) 50 MG 24 hr tablet Take 1 tablet (50 mg total) by mouth daily. 10/28/15   Maryann Mikhail, DO  potassium chloride SA (K-DUR,KLOR-CON) 20 MEQ tablet Take 20 mEq by mouth daily. 10/29/15   Historical Provider, MD    Family History Family History  Problem Relation Age of Onset  . Cancer Mother     Lung (heavy smoker)  d. 3946  . Cancer Father     Lung cancer (heavy smoker)  d 4282  . Drug abuse Son     Social History Social History  Substance Use Topics  . Smoking status: Former Smoker    Packs/day: 1.00    Years: 30.00    Types: Cigarettes    Quit date: 02/22/2013  . Smokeless tobacco: Never Used  . Alcohol use No  Allergies   Review of patient's allergies indicates no known allergies.   Review of Systems Review of Systems  Constitutional: Positive for diaphoresis. Negative for chills and fatigue.  Respiratory: Negative for chest tightness and shortness of breath.   Cardiovascular: Negative for chest pain.  Gastrointestinal: Negative for abdominal pain.  Neurological: Positive for dizziness and light-headedness. Negative for weakness and numbness.  All other systems reviewed and are negative.    Physical Exam Updated Vital Signs BP  123/76 (BP Location: Right Arm)   Pulse 93   Resp 21   Ht 6\' 1"  (1.854 m)   Wt (!) 202.8 kg   SpO2 94%   BMI 58.97 kg/m   Physical Exam  Constitutional: He appears well-developed and well-nourished.  HENT:  Head: Normocephalic and atraumatic.  Eyes: Conjunctivae are normal.  Neck: Neck supple.  Cardiovascular: Regular rhythm.   No murmur heard. Pulmonary/Chest: Effort normal and breath sounds normal. No respiratory distress.  Abdominal: Soft. There is no tenderness.  Musculoskeletal: He exhibits no edema.  Neurological: He is alert.  Skin: Skin is warm and dry.  Psychiatric: He has a normal mood and affect.  Nursing note and vitals reviewed.    ED Treatments / Results  Labs (all labs ordered are listed, but only abnormal results are displayed) Labs Reviewed - No data to display  EKG  EKG Interpretation  Date/Time:  Wednesday December 05 2015 18:56:17 EDT Ventricular Rate:  103 PR Interval:  182 QRS Duration: 92 QT Interval:  346 QTC Calculation: 453 R Axis:   2 Text Interpretation:  Sinus tachycardia Otherwise normal ECG No significant change since last tracing Confirmed by FLOYD MD, Reuel Boom ([id number]) on 12/05/2015 7:03:05 PM       Radiology No results found.  Procedures Procedures (including critical care time)  Medications Ordered in ED Medications - No data to display   Initial Impression / Assessment and Plan / ED Course  I have reviewed the triage vital signs and the nursing notes.  Pertinent labs & imaging results that were available during my care of the patient were reviewed by me and considered in my medical decision making (see chart for details).  Clinical Course    Patient is a 55 year old male with a past medical history of ACS, CHF, atrial flutter OSA on CPAP, and obesity who comes in today complaining of an episode which occurred earlier at approximately 3 PM when the patient was on the telephone and became angered.  Patient had sudden  onset of diaphoresis lightheadedness and dizziness.  Patient denies chest pain or shortness of breath.  Patient denies fevers chills nausea or vomiting.  Physical exam: Patient clear to auscultation bilaterally with normal S1-S2 no rubs murmurs gallops.  Abdomen is soft and nontender.  Remainder physical exam largely within normal limits.  Per EMS rhythm strips patient was in SVT in route.  On arrival patient received EKG that showed sinus tachycardia with a rate of 103 bpm.  On speaking with the patient he states that his heart ranges  from 80 beats a minute to 110 beats a minute normally.  Patient states that his symptoms have resolved and that he no longer feels lightheaded or dizzy.  Believe patient had episode of SVT resulting the after mentioned symptoms.  Doubt ACS given lack of chest pain or shortness of breath.  EKG findings reassuring.  Laboratory workup largely WNL and no concerning findings. Pt given appropriate f/u and return precautions. Pt voiced understanding and is agreeable to discharge  at this time.    Final Clinical Impressions(s) / ED Diagnoses   Final diagnoses:  SVT (supraventricular tachycardia) [facility])    New Prescriptions Discharge Medication List as of 12/05/2015  7:42 PM       Caren Griffins, MD 12/05/15 2344    Melene Plan, DO 12/06/15 0006

## 2015-12-05 NOTE — ED Notes (Signed)
EDP at bedside  

## 2015-12-06 ENCOUNTER — Telehealth: Payer: Self-pay | Admitting: Internal Medicine

## 2015-12-06 NOTE — Telephone Encounter (Signed)
New Message  Pt daughter called to schedule pt for an ablation. Please call back to discuss

## 2015-12-06 NOTE — Telephone Encounter (Signed)
Will forward to Dr. Graciela HusbandsKlein to review as this looks to be for an SVT ablation.

## 2015-12-07 NOTE — Telephone Encounter (Signed)
Pt scheduled to see Camnitz on Monday, 9/25, to discuss ablation.

## 2015-12-07 NOTE — Telephone Encounter (Signed)
Jesse Hurst and Jesse Hurst  Can we clarify with this family if he really wants an ablation,  Not clear when he saw RU but his daughter called back If so [facility] both A[facility]utter and SVT both documented

## 2015-12-09 ENCOUNTER — Encounter: Payer: Self-pay | Admitting: Family Medicine

## 2015-12-09 NOTE — Progress Notes (Signed)
Electrophysiology Office Note   Date:  12/10/2015   ID:  Jesse Hurst, DOB 1960-11-04, MRN [id number]  PCP:  [name], MD  Cardiologist:  Sharyn Lull Primary Electrophysiologist: Doran Durand, MD    Chief Complaint  Patient presents with  . Atrial Flutter     History of Present Illness: Jesse Hurst is a 55 y.o. male who presents today for electrophysiology evaluation.   History of  history of CAD by notes diffuse LAD disease in 2014, morbid obesity, tobacco use, OSA/hypoventilatory syndrome, non-compliance. Had been off all cardiac meds for 6 months when presented with SOB, fatigue, LE swelling.  Admitted with new onset AFlutter and CHF. Had TEE/CV with sinus rhythm post.  Required ETT intubation anas was unable to be extubated.  Went into SVT the evening prior to him leaving AMA. Presented to EP clinic feeling much improved after compliance with CPAP. Working to lose weight and stop smoking. He says that he has lost weight since last being seen. He presented to the emergency room 5 days ago after an episode of diaphoresis and mild chest discomfort. He was on a phone call when it started he says that he was angry at the time. No exacerbating or alleviating factors. EMS reports that his heart rate was around 200 at the time. He received medications which helped with his symptoms and slowed his heart rate into the low 100s.   Today, he denies symptoms of palpitations, chest pain, shortness of breath, orthopnea, PND, lower extremity edema, claudication, dizziness, presyncope, syncope, bleeding, or neurologic sequela. The patient is tolerating medications without difficulties and is otherwise without complaint today.    Past Medical History:  Diagnosis Date  . Acute coronary syndrome (HCC) 02/22/2013   Small MI vs demand ischemia in the setting of PSVT  . Chronic systolic CHF (congestive heart failure) (HCC)   . History of atrial flutter 10/2015   Pt considering ablation  procedure by Dr. Graciela Husbands as of 12/07/15.  Marland Kitchen History of PSVT (paroxysmal supraventricular tachycardia)   . Obesity, morbid (HCC) 12/21/2012  . OSA on CPAP 2017   Sleep study set for 12/17/15  . Tobacco dependence 10/2015   Past Surgical History:  Procedure Laterality Date  . CARDIAC CATHETERIZATION  02/2013   Diffuse distal LAD dz, no intervention  . CARDIOVERSION N/A 10/25/2015   Procedure: CARDIOVERSION;  Surgeon: Orpah Cobb, MD;  Location: [facility] ENDOSCOPY;  Service: Cardiovascular;  Laterality: N/A;  . LEFT HEART CATHETERIZATION WITH CORONARY ANGIOGRAM N/A 02/24/2013   Procedure: LEFT HEART CATHETERIZATION WITH CORONARY ANGIOGRAM;  Surgeon: Robynn Pane, MD;  Location: [facility] CATH LAB;  Service: Cardiovascular;  Laterality: N/A;  . TEE WITHOUT CARDIOVERSION N/A 10/25/2015   Procedure: TRANSESOPHAGEAL ECHOCARDIOGRAM (TEE);  Surgeon: Orpah Cobb, MD;  Location: [facility] ENDOSCOPY;  Service: Cardiovascular;  Laterality: N/A;  . TRANSTHORACIC ECHOCARDIOGRAM  02/2013; 10/2015   2014: LVH, EF 45-50 %.  2017: EF 45-50%.  Hypokinesis of the anteroseptal myocardium.  Moderate mitral regurg.     Current Outpatient Prescriptions  Medication Sig Dispense Refill  . apixaban (ELIQUIS) 5 MG TABS tablet Take 1 tablet (5 mg total) by mouth 2 (two) times daily. 60 tablet 0  . furosemide (LASIX) 80 MG tablet Take 80 mg by mouth daily.  3  . metoprolol succinate (TOPROL-XL) 50 MG 24 hr tablet Take 1 tablet (50 mg total) by mouth daily. 30 tablet 0  . potassium chloride SA (K-DUR,KLOR-CON) 20 MEQ tablet Take 20 mEq by mouth daily.  3  No current facility-administered medications for this visit.     Allergies:   Review of patient's allergies indicates no known allergies.   Social History:  The patient  reports that he quit smoking about 2 years ago. His smoking use included Cigarettes. He has a 30.00 pack-year smoking history. He has never used smokeless tobacco. He reports that he does not drink alcohol or use  drugs.   Family History:  The patient's family history includes Cancer in his father and mother; Drug abuse in his son.    ROS:  Please see the history of present illness.   Otherwise, review of systems is positive for none.   All other systems are reviewed and negative.    PHYSICAL EXAM: VS:  BP 116/80   Pulse 88   Ht 6' 2.5" (1.892 m)   Wt (!) 447 lb 6.4 oz (202.9 kg)   SpO2 93%   BMI 56.67 kg/m  , BMI Body mass index is 56.67 kg/m. GEN: Well nourished, well developed, in no acute distress  HEENT: normal  Neck: no JVD, carotid bruits, or masses Cardiac: RRR; no murmurs, rubs, or gallops,no edema  Respiratory:  clear to auscultation bilaterally, normal work of breathing GI: soft, nontender, nondistended, + BS MS: no deformity or atrophy  Skin: warm and dry Neuro:  Strength and sensation are intact Psych: euthymic mood, full affect  EKG:  EKG is ordered today. Personal review of the ekg ordered shows sinus rhythm, rate 88   Recent Labs: 10/22/2015: B Natriuretic Peptide 72.8; TSH 1.462 10/28/2015: Hemoglobin 11.8; Platelets 223 11/02/2015: BUN 13; Creatinine, Ser 0.98; Potassium 4.5; Sodium 143    Lipid Panel     Component Value Date/Time   CHOL 123 10/23/2015 0353   TRIG 92 10/23/2015 0353   HDL 24 (L) 10/23/2015 0353   CHOLHDL 5.1 10/23/2015 0353   VLDL 18 10/23/2015 0353   LDLCALC 81 10/23/2015 0353     Wt Readings from Last 3 Encounters:  12/10/15 (!) 447 lb 6.4 oz (202.9 kg)  12/05/15 (!) 447 lb (202.8 kg)  12/04/15 (!) 447 lb (202.8 kg)      Other studies Reviewed: Additional studies/ records that were reviewed today include: TTE 10/23/15  Review of the above records today demonstrates:  - Very technically difficult study due to body habitus. Definity   contrast images were performed. There is grossly hyperdynamic LV   function wtih EF around 70%. The study is inadequate to exclude   wall motion abnormalities or for valvular evaulation. Consider   TEE  for better image quality if necessary.  TEE 10/25/15 - Left ventricle: Systolic function was mildly reduced. The   estimated ejection fraction was in the range of 45% to 50%.   Hypokinesis of the anteroseptal myocardium. - Mitral valve: There was moderate regurgitation, with multiple   jets directed centrally. - Left atrium: The atrium was moderately dilated. No evidence of   thrombus in the atrial cavity or appendage. - Right atrium: The atrium was mildly dilated. No evidence of   thrombus in the atrial cavity or appendage. - Atrial septum: No defect or patent foramen ovale was identified. - Superior vena cava: The study excluded a thrombus.  ASSESSMENT AND PLAN:  1.  Atrial flutter with RVR: Currently on Eliquis, as there is talk of potential ablation in the future. I have discussed with him the risks and benefits of ablation. Risks include bleeding, tamponade, heart block, and stroke. He says that he would like to have  an ablation done. We Christopher Hink have to check on the weight limit of the table before scheduling a procedure.  This patients CHA2DS2-VASc Score and unadjusted Ischemic Stroke Rate (% per year) is equal to 0.2 % stroke rate/year from a score of 0  Above score calculated as 1 point each if present [CHF, HTN, DM, Vascular=MI/PAD/Aortic Plaque, Age if 65-74, or Male] Above score calculated as 2 points each if present [Age > 75, or Stroke/TIA/TE]  2. SVT: Has had episodes of narrow complex tachycardia. Further discuss the risks and benefits of ablation. Again as above, we'll have to check the weight limit of the table.  3. Morbid obesity: Encouraged weight loss. Referral for bariatric surgery  4. OSA: Compliant with CPAP    Current medicines are reviewed at length with the patient today.   The patient does not have concerns regarding his medicines.  The following changes were made today:  Increase toprol XL to BID  Labs/ tests ordered today include:  No orders of the  defined types were placed in this encounter.    Disposition:   FU with Barb Shear 3 months  Signed, Fairy Ashlock Jorja LoaMartin Josafat Enrico, MD  12/10/2015 8:39 AM     [facility] [address] [place] [address] [phone] (office) [phone] (fax)

## 2015-12-10 ENCOUNTER — Ambulatory Visit (INDEPENDENT_AMBULATORY_CARE_PROVIDER_SITE_OTHER): Payer: BLUE CROSS/BLUE SHIELD | Admitting: Cardiology

## 2015-12-10 ENCOUNTER — Encounter: Payer: Self-pay | Admitting: Cardiology

## 2015-12-10 VITALS — BP 116/80 | HR 88 | Ht 74.5 in | Wt >= 6400 oz

## 2015-12-10 DIAGNOSIS — I483 Typical atrial flutter: Secondary | ICD-10-CM

## 2015-12-10 DIAGNOSIS — I471 Supraventricular tachycardia: Secondary | ICD-10-CM

## 2015-12-10 MED ORDER — METOPROLOL SUCCINATE ER 50 MG PO TB24: 50.0000 mg | ORAL_TABLET | Freq: Two times a day (BID) | ORAL | 6 refills | Status: DC

## 2015-12-10 NOTE — Patient Instructions (Addendum)
Medication Instructions:   Your physician has recommended you make the following change in your medication:   1) INCREASE Toprol to 50 mg twice daily  --- If you need a refill on your cardiac medications before your next appointment, please call your pharmacy. ---  Labwork:  None ordered  Testing/Procedures:  None ordered  Follow-Up:  Your physician recommends that you schedule a follow-up appointment in: 3 months with Dr. Elberta Fortisamnitz.  Any Other Special Instructions Will Be Listed Below (If Applicable). Bariatric Surgery You have so much to gain by losing weight.  You may have already tried every diet and exercise plan imaginable.  And, you may have sought advice from your family physician, too.   Sometimes, in spite of such diligent efforts, you may not be able to achieve long-term results by yourself.  In cases of severe obesity, bariatric or weight loss surgery is a proven method of achieving long-term weight control.  Our Services Our bariatric surgery programs offer our patients new hope and long-term weight-loss solution.  Since introducing our services in 2003, we have conducted more than 2,400 successful procedures.  Our program is designated as a Investment banker, corporateComprehensive Center by the Metabolic and Bariatric Surgery Accreditation and Quality Improvement Program (MBSAQIP), a Child psychotherapistnational accrediting body that sets rigorous patient safety and outcome standards.  Our program is also designated as a Engineer, manufacturing systemsCenter of Excellence by [employer] insurance companies.   Our exceptional weight-loss surgery team specializes in diagnosis, treatment, follow-up care, and ongoing support for our patients with severe weight loss challenges.  We currently offer laparoscopic sleeve gastrectomy, gastric bypass, and adjustable gastric band (LAP-BAND).    Attend our Bariatrics Seminar Choosing to undergo a bariatric procedure is a big decision, and one that should not be taken lightly.  You now have two options in how you learn  about weight-loss surgery - in person or online.  Our objective is to ensure you have all of the information that you need to evaluate the advantages and obligations of this life changing procedure.  Please note that you are not alone in this process, and our experienced team is ready to assist and answer all of your questions.  There are several ways to register for a seminar (either on-line or in person): 1)  Call [phone] 2) Go on-line to [facility] and register for either type of seminar.  [email or web address]    Thank you for choosing [facility]!!   Dory HornSherri Desirai Traxler, RN [phone]

## 2015-12-12 ENCOUNTER — Telehealth: Payer: Self-pay | Admitting: Cardiology

## 2015-12-12 NOTE — Telephone Encounter (Signed)
Ok to speak with dtr (ok per verbal from patient). She is aware I will call her tomorrow to arrange this procedure.

## 2015-12-12 NOTE — Telephone Encounter (Signed)
New message      Pt was seen recently by Dr Elberta Fortisamnitz.  Calling to see if he can have the procedure discussed at the ov.  Please call

## 2015-12-13 NOTE — Telephone Encounter (Signed)
Informed dtr that I would call her next Tuesday to arrange procedure.  She is aware I am going to discuss possible dates with Dr. Elberta Fortisamnitz. She is agreeable and thanks me for calling and informing her.

## 2015-12-14 ENCOUNTER — Encounter: Payer: Self-pay | Admitting: Family Medicine

## 2015-12-16 HISTORY — PX: OTHER SURGICAL HISTORY: SHX169

## 2015-12-17 ENCOUNTER — Ambulatory Visit (HOSPITAL_BASED_OUTPATIENT_CLINIC_OR_DEPARTMENT_OTHER): Payer: BLUE CROSS/BLUE SHIELD | Attending: Internal Medicine | Admitting: Internal Medicine

## 2015-12-17 VITALS — Ht 74.0 in | Wt >= 6400 oz

## 2015-12-17 DIAGNOSIS — G4730 Sleep apnea, unspecified: Secondary | ICD-10-CM

## 2015-12-17 DIAGNOSIS — G4733 Obstructive sleep apnea (adult) (pediatric): Secondary | ICD-10-CM | POA: Insufficient documentation

## 2015-12-20 ENCOUNTER — Telehealth: Payer: Self-pay | Admitting: Cardiology

## 2015-12-20 DIAGNOSIS — Z01812 Encounter for preprocedural laboratory examination: Secondary | ICD-10-CM

## 2015-12-20 DIAGNOSIS — I483 Typical atrial flutter: Secondary | ICD-10-CM

## 2015-12-20 NOTE — Telephone Encounter (Signed)
Follow Up:     Daughter says she have been waiting to hear from Sherri,concerning pt's procedure.

## 2015-12-20 NOTE — Telephone Encounter (Addendum)
Pt's daughter states she has been conversing with Sherri in regards to scheduling pt's ablation.  I advised her you would be back in office tomorrow.  She is not listed on DPR.

## 2015-12-21 NOTE — Telephone Encounter (Signed)
Informed soonest ablation date is 11/29.  She would like to scheduled patient for this day.  She is currently at work and cannot talk.  She would like me to call her back on Monday to review instructions.

## 2015-12-24 ENCOUNTER — Encounter: Payer: Self-pay | Admitting: *Deleted

## 2015-12-24 NOTE — Telephone Encounter (Signed)
Informed dtr cannot schedule pt for ablation on 11/29. AFlutter ablation scheduled for 02/28/16. Letter of instructions reviewed with dtr and mailed to pt's home address. Pre procedure labs on 02/19/16. Follow up appts made. Dtr verbalized understanding and agreeable to plan.

## 2015-12-26 ENCOUNTER — Ambulatory Visit: Payer: BLUE CROSS/BLUE SHIELD | Admitting: Internal Medicine

## 2015-12-29 ENCOUNTER — Encounter: Payer: Self-pay | Admitting: Family Medicine

## 2015-12-29 NOTE — Procedures (Signed)
Patient Name: Jesse Hurst, Jesse Hurst Date: 12/17/2015 Gender: Male D.O.B: 1961/01/18 Age (years): 60 Referring Provider: MaryAnn Mikhail Height (inches): 74 Interpreting Physician: Baird Lyons MD, ABSM Weight (lbs): 447 RPSGT: Madelon Lips BMI: 57 MRN: [id number] Neck Size: 22.00 CLINICAL INFORMATION Sleep Study Type: Split Night CPAP   Indication for sleep study: OSA   Epworth Sleepiness Score: 22/24 SLEEP STUDY TECHNIQUE As per the AASM Manual for the Scoring of Sleep and Associated Events v2.3 (April 2016) with a hypopnea requiring 4% desaturations. The channels recorded and monitored were frontal, central and occipital EEG, electrooculogram (EOG), submentalis EMG (chin), nasal and oral airflow, thoracic and abdominal wall motion, anterior tibialis EMG, snore microphone, electrocardiogram, and pulse oximetry. Continuous positive airway pressure (CPAP) was initiated when the patient met split night criteria and was titrated according to treat sleep-disordered breathing.  MEDICATIONS Medications self-administered by patient taken the night of the study : none reported  RESPIRATORY PARAMETERS Diagnostic Total AHI (/hr): 119.3 RDI (/hr): 120.7 OA Index (/hr): 98.9 CA Index (/hr): 0.0 REM AHI (/hr): N/A NREM AHI (/hr): 119.3 Supine AHI (/hr): N/A Non-supine AHI (/hr): 119.33 Min O2 Sat (%): 75.00 Mean O2 (%): 86.95 Time below 88% (min): 78.7   Titration Optimal Pressure (cm): 12 AHI at Optimal Pressure (/hr): 1.1 Min O2 at Optimal Pressure (%): 85.0 Supine % at Optimal (%): 57 Sleep % at Optimal (%): 99    SLEEP ARCHITECTURE The recording time for the entire night was 397.3 minutes. During a baseline period of 202.5 minutes, the patient slept for 134.7 minutes in REM and nonREM, yielding a sleep efficiency of 66.5%. Sleep onset after lights out was 6.3 minutes with a REM latency of N/A minutes. The patient spent 70.69% of the night in stage N1 sleep, 29.31% in stage N2 sleep,  0.00% in stage N3 and 0.00% in REM. During the titration period of 189.8 minutes, the patient slept for 180.0 minutes in REM and nonREM, yielding a sleep efficiency of 94.8%. Sleep onset after CPAP initiation was 4.8 minutes with a REM latency of 26.0 minutes. The patient spent 10.28% of the night in stage N1 sleep, 57.79% in stage N2 sleep, 0.00% in stage N3 and 31.94% in REM.  CARDIAC DATA The 2 lead EKG demonstrated sinus rhythm. The mean heart rate was 78.96 beats per minute. Other EKG findings include: None.  LEG MOVEMENT DATA The total Periodic Limb Movements of Sleep (PLMS) were 0. The PLMS index was 0.00 .  IMPRESSIONS - Severe obstructive sleep apnea occurred during the diagnostic portion of the study (AHI = 119.3/hour). An optimal PAP pressure was selected for this patient ( 12 cm of water) - No significant central sleep apnea occurred during the diagnostic portion of the study (CAI = 0.0/hour). - Severe oxygen desaturation was noted during the diagnostic portion of the study (Min O2 = 75.00%). - The patient snored with Loud snoring volume during the diagnostic portion of the study. - No cardiac abnormalities were noted during this study. - Clinically significant periodic limb movements did not occur during sleep.  DIAGNOSIS - Obstructive Sleep Apnea (327.23 [G47.33 ICD-10])  RECOMMENDATIONS - Trial of CPAP therapy on 12 cm H2O with a Large size Resmed Full Face Mask AirFit F20 mask and heated humidification. - Avoid alcohol, sedatives and other CNS depressants that may worsen sleep apnea and disrupt normal sleep architecture. - Sleep hygiene should be reviewed to assess factors that may improve sleep quality. - Weight management and regular exercise should be initiated or continued.  [  Electronically signed] 12/29/2015 10:57 AM  Baird Lyons MD, ABSM Diplomate, American Board of Sleep Medicine   NPI: [id number]  [place], American Board of Sleep  Medicine  ELECTRONICALLY SIGNED ON:  12/29/2015, 10:58 AM [place] PH: [phone]   FX: [phone] [place]

## 2016-02-19 ENCOUNTER — Other Ambulatory Visit (INDEPENDENT_AMBULATORY_CARE_PROVIDER_SITE_OTHER): Payer: BLUE CROSS/BLUE SHIELD

## 2016-02-19 DIAGNOSIS — I483 Typical atrial flutter: Secondary | ICD-10-CM | POA: Diagnosis not present

## 2016-02-19 DIAGNOSIS — Z01812 Encounter for preprocedural laboratory examination: Secondary | ICD-10-CM

## 2016-02-19 LAB — BASIC METABOLIC PANEL
BUN: 12 mg/dL (ref 7–25)
CO2: 26 mmol/L (ref 20–31)
Calcium: 8.8 mg/dL (ref 8.6–10.3)
Chloride: 105 mmol/L (ref 98–110)
Creat: 0.83 mg/dL (ref 0.70–1.33)
GLUCOSE: 129 mg/dL — AB (ref 65–99)
POTASSIUM: 4.5 mmol/L (ref 3.5–5.3)
SODIUM: 138 mmol/L (ref 135–146)

## 2016-02-19 LAB — CBC WITH DIFFERENTIAL/PLATELET
BASOS ABS: 95 {cells}/uL (ref 0–200)
Basophils Relative: 1 %
EOS ABS: 475 {cells}/uL (ref 15–500)
Eosinophils Relative: 5 %
HEMATOCRIT: 41.1 % (ref 38.5–50.0)
HEMOGLOBIN: 13.7 g/dL (ref 13.2–17.1)
LYMPHS ABS: 2660 {cells}/uL (ref 850–3900)
Lymphocytes Relative: 28 %
MCH: 29.8 pg (ref 27.0–33.0)
MCHC: 33.3 g/dL (ref 32.0–36.0)
MCV: 89.5 fL (ref 80.0–100.0)
MONO ABS: 665 {cells}/uL (ref 200–950)
MPV: 9.5 fL (ref 7.5–12.5)
Monocytes Relative: 7 %
NEUTROS PCT: 59 %
Neutro Abs: 5605 cells/uL (ref 1500–7800)
Platelets: 303 10*3/uL (ref 140–400)
RBC: 4.59 MIL/uL (ref 4.20–5.80)
RDW: 14.4 % (ref 11.0–15.0)
WBC: 9.5 10*3/uL (ref 3.8–10.8)

## 2016-02-28 ENCOUNTER — Encounter (HOSPITAL_COMMUNITY)
Admission: RE | Disposition: A | Discharge status: Home / Self Care | Payer: Self-pay | Source: Ambulatory Visit | Attending: Cardiology

## 2016-02-28 ENCOUNTER — Ambulatory Visit (HOSPITAL_COMMUNITY): Payer: BLUE CROSS/BLUE SHIELD | Admitting: Certified Registered"

## 2016-02-28 ENCOUNTER — Ambulatory Visit (HOSPITAL_COMMUNITY)
Admission: RE | Admit: 2016-02-28 | Discharge: 2016-02-29 | Disposition: A | Discharge status: Home / Self Care | Payer: BLUE CROSS/BLUE SHIELD | Source: Ambulatory Visit | Attending: Cardiology | Admitting: Cardiology

## 2016-02-28 ENCOUNTER — Encounter (HOSPITAL_COMMUNITY): Payer: Self-pay | Admitting: Certified Registered"

## 2016-02-28 DIAGNOSIS — I471 Supraventricular tachycardia: Secondary | ICD-10-CM | POA: Diagnosis present

## 2016-02-28 DIAGNOSIS — Z6841 Body Mass Index (BMI) 40.0 and over, adult: Secondary | ICD-10-CM | POA: Insufficient documentation

## 2016-02-28 DIAGNOSIS — I5022 Chronic systolic (congestive) heart failure: Secondary | ICD-10-CM | POA: Insufficient documentation

## 2016-02-28 DIAGNOSIS — I4892 Unspecified atrial flutter: Secondary | ICD-10-CM | POA: Insufficient documentation

## 2016-02-28 DIAGNOSIS — Z87891 Personal history of nicotine dependence: Secondary | ICD-10-CM | POA: Insufficient documentation

## 2016-02-28 DIAGNOSIS — I2510 Atherosclerotic heart disease of native coronary artery without angina pectoris: Secondary | ICD-10-CM | POA: Insufficient documentation

## 2016-02-28 DIAGNOSIS — I110 Hypertensive heart disease with heart failure: Secondary | ICD-10-CM | POA: Diagnosis not present

## 2016-02-28 DIAGNOSIS — I252 Old myocardial infarction: Secondary | ICD-10-CM | POA: Diagnosis not present

## 2016-02-28 DIAGNOSIS — Z7901 Long term (current) use of anticoagulants: Secondary | ICD-10-CM | POA: Diagnosis not present

## 2016-02-28 DIAGNOSIS — E662 Morbid (severe) obesity with alveolar hypoventilation: Secondary | ICD-10-CM | POA: Insufficient documentation

## 2016-02-28 DIAGNOSIS — Z79899 Other long term (current) drug therapy: Secondary | ICD-10-CM | POA: Insufficient documentation

## 2016-02-28 HISTORY — PX: ELECTROPHYSIOLOGIC STUDY: SHX172A

## 2016-02-28 SURGERY — A-FLUTTER ABLATION
Anesthesia: General

## 2016-02-28 MED ORDER — BUPIVACAINE HCL (PF) 0.25 % IJ SOLN
INTRAMUSCULAR | Status: AC
  Filled 2016-02-28: qty 60

## 2016-02-28 MED ORDER — ISOPROTERENOL HCL 0.2 MG/ML IJ SOLN
INTRAVENOUS | Status: DC | PRN
  Administered 2016-02-28: 2 ug/min via INTRAVENOUS

## 2016-02-28 MED ORDER — POTASSIUM CHLORIDE CRYS ER 20 MEQ PO TBCR
20.0000 meq | EXTENDED_RELEASE_TABLET | Freq: Every day | ORAL | Status: DC
  Administered 2016-02-29: 20 meq via ORAL
  Filled 2016-02-28: qty 1

## 2016-02-28 MED ORDER — APIXABAN 5 MG PO TABS
5.0000 mg | ORAL_TABLET | Freq: Two times a day (BID) | ORAL | Status: DC
  Administered 2016-02-29: 5 mg via ORAL
  Filled 2016-02-28: qty 1

## 2016-02-28 MED ORDER — MIDAZOLAM HCL 5 MG/5ML IJ SOLN
INTRAMUSCULAR | Status: DC | PRN
  Administered 2016-02-28: 2 mg via INTRAVENOUS

## 2016-02-28 MED ORDER — METOPROLOL SUCCINATE ER 50 MG PO TB24
50.0000 mg | ORAL_TABLET | Freq: Two times a day (BID) | ORAL | Status: DC
  Administered 2016-02-28 – 2016-02-29 (×2): 50 mg via ORAL
  Filled 2016-02-28 (×2): qty 1

## 2016-02-28 MED ORDER — SODIUM CHLORIDE 0.9 % IV SOLN: 250.0000 mL | INTRAVENOUS | Status: DC | PRN

## 2016-02-28 MED ORDER — BUPIVACAINE HCL (PF) 0.25 % IJ SOLN
INTRAMUSCULAR | Status: DC | PRN
  Administered 2016-02-28: 45 mL

## 2016-02-28 MED ORDER — FENTANYL CITRATE (PF) 100 MCG/2ML IJ SOLN
INTRAMUSCULAR | Status: DC | PRN
  Administered 2016-02-28: 100 ug via INTRAVENOUS

## 2016-02-28 MED ORDER — SODIUM CHLORIDE 0.9% FLUSH
3.0000 mL | Freq: Two times a day (BID) | INTRAVENOUS | Status: DC
  Administered 2016-02-28: 3 mL via INTRAVENOUS

## 2016-02-28 MED ORDER — ACETAMINOPHEN 325 MG PO TABS: 650.0000 mg | ORAL_TABLET | ORAL | Status: DC | PRN

## 2016-02-28 MED ORDER — LIDOCAINE 2% (20 MG/ML) 5 ML SYRINGE
INTRAMUSCULAR | Status: DC | PRN
  Administered 2016-02-28: 100 mg via INTRAVENOUS

## 2016-02-28 MED ORDER — ONDANSETRON HCL 4 MG/2ML IJ SOLN: 4.0000 mg | Freq: Four times a day (QID) | INTRAMUSCULAR | Status: DC | PRN

## 2016-02-28 MED ORDER — FUROSEMIDE 80 MG PO TABS
80.0000 mg | ORAL_TABLET | Freq: Every day | ORAL | Status: DC
  Administered 2016-02-29: 80 mg via ORAL
  Filled 2016-02-28: qty 1

## 2016-02-28 MED ORDER — PHENYLEPHRINE HCL 10 MG/ML IJ SOLN
INTRAVENOUS | Status: DC | PRN
  Administered 2016-02-28: 40 ug/min via INTRAVENOUS

## 2016-02-28 MED ORDER — ISOPROTERENOL HCL 0.2 MG/ML IJ SOLN
INTRAMUSCULAR | Status: AC
  Filled 2016-02-28: qty 5

## 2016-02-28 MED ORDER — SODIUM CHLORIDE 0.9 % IV SOLN
INTRAVENOUS | Status: DC
  Administered 2016-02-28: 13:00:00 via INTRAVENOUS
  Administered 2016-02-28: 200 mL via INTRAVENOUS

## 2016-02-28 MED ORDER — HEPARIN (PORCINE) IN NACL 2-0.9 UNIT/ML-% IJ SOLN: INTRAMUSCULAR | Status: DC | PRN

## 2016-02-28 MED ORDER — HEPARIN (PORCINE) IN NACL 2-0.9 UNIT/ML-% IJ SOLN
INTRAMUSCULAR | Status: DC | PRN
  Administered 2016-02-28 (×2): 500 mL

## 2016-02-28 MED ORDER — PHENYLEPHRINE 40 MCG/ML (10ML) SYRINGE FOR IV PUSH (FOR BLOOD PRESSURE SUPPORT)
PREFILLED_SYRINGE | INTRAVENOUS | Status: DC | PRN
  Administered 2016-02-28: 80 ug via INTRAVENOUS
  Administered 2016-02-28: 120 ug via INTRAVENOUS
  Administered 2016-02-28 (×2): 80 ug via INTRAVENOUS
  Administered 2016-02-28: 40 ug via INTRAVENOUS

## 2016-02-28 MED ORDER — SODIUM CHLORIDE 0.9% FLUSH: 3.0000 mL | INTRAVENOUS | Status: DC | PRN

## 2016-02-28 MED ORDER — HEPARIN (PORCINE) IN NACL 2-0.9 UNIT/ML-% IJ SOLN
INTRAMUSCULAR | Status: AC
  Filled 2016-02-28: qty 1000

## 2016-02-28 MED ORDER — PROPOFOL 10 MG/ML IV BOLUS
INTRAVENOUS | Status: DC | PRN
Start: 2016-02-28 — End: 2016-02-28
  Administered 2016-02-28: 250 mg via INTRAVENOUS
  Administered 2016-02-28: 50 mg via INTRAVENOUS

## 2016-02-28 SURGICAL SUPPLY — 13 items
BAG SNAP BAND KOVER 36X36 (MISCELLANEOUS) ×3 IMPLANT
BLANKET WARM UNDERBOD FULL ACC (MISCELLANEOUS) ×3 IMPLANT
CATH EZ STEER NAV 8MM D-F CUR (ABLATOR) ×3 IMPLANT
CATH JOSEPHSON QUAD-ALLRED 6FR (CATHETERS) ×3 IMPLANT
CATH WEBSTER BI DIR CS D-F CRV (CATHETERS) ×3 IMPLANT
PACK EP LATEX FREE (CUSTOM PROCEDURE TRAY) ×2
PACK EP LF (CUSTOM PROCEDURE TRAY) ×1 IMPLANT
PAD DEFIB LIFELINK (PAD) ×3 IMPLANT
PATCH CARTO3 (PAD) ×3 IMPLANT
SHEATH PINNACLE 6F 10CM (SHEATH) ×3 IMPLANT
SHEATH PINNACLE 7F 10CM (SHEATH) ×3 IMPLANT
SHEATH PINNACLE 8F 10CM (SHEATH) ×3 IMPLANT
SHIELD RADPAD SCOOP 12X17 (MISCELLANEOUS) ×3 IMPLANT

## 2016-02-28 NOTE — Transfer of Care (Signed)
Immediate Anesthesia Transfer of Care Note  Patient: Jesse SauerDaniel Hurst  Procedure(s) Performed: Procedure(s): A-Flutter Ablation (N/A)  Patient Location: Cath Lab  Anesthesia Type:General  Level of Consciousness: awake, oriented and patient cooperative  Airway & Oxygen Therapy: Patient Spontanous Breathing and Patient connected to nasal cannula oxygen  Post-op Assessment: Report given to RN, Post -op Vital signs reviewed and stable and Patient moving all extremities  Post vital signs: Reviewed and stable  Last Vitals:  Vitals:   02/28/16 0937  BP: 111/69  Pulse: 81  Resp: 18  Temp: 36.7 C    Last Pain:  Vitals:   02/28/16 0937  TempSrc: Oral         Complications: No apparent anesthesia complications

## 2016-02-28 NOTE — Anesthesia Preprocedure Evaluation (Signed)
Anesthesia Evaluation  Patient identified by MRN, date of birth, ID band Patient awake    Reviewed: Allergy & Precautions, NPO status   Airway Mallampati: III  TM Distance: <3 FB Neck ROM: Full    Dental  (+) Teeth Intact, Poor Dentition, Loose   Pulmonary sleep apnea , former smoker,    breath sounds clear to auscultation       Cardiovascular hypertension, +CHF  + dysrhythmias  Rhythm:Irregular Rate:Normal     Neuro/Psych    GI/Hepatic   Endo/Other  Morbid obesity380!!  Renal/GU      Musculoskeletal   Abdominal (+) + obese,   Peds  Hematology   Anesthesia Other Findings   Reproductive/Obstetrics                             Anesthesia Physical Anesthesia Plan  ASA: III  Anesthesia Plan: General   Post-op Pain Management:    Induction: Intravenous  Airway Management Planned: Video Laryngoscope Planned and Oral ETT  Additional Equipment:   Intra-op Plan:   Post-operative Plan: Extubation in OR  Informed Consent: I have reviewed the patients History and Physical, chart, labs and discussed the procedure including the risks, benefits and alternatives for the proposed anesthesia with the patient or authorized representative who has indicated his/her understanding and acceptance.     Plan Discussed with:   Anesthesia Plan Comments:         Anesthesia Quick Evaluation

## 2016-02-28 NOTE — H&P (Signed)
Electrophysiology Office Note   Date:  12/10/2015   ID:  Jesse Hurst, DOB [birth year], MRN [id number]  PCP:  Jesse MassedMCGOWEN,PHILIP H, MD       Cardiologist:  Sharyn LullHarwani Primary Electrophysiologist: Doran DurandKlein, Will Martin Camnitz, MD             Chief Complaint  Patient presents with  . Atrial Flutter     History of Present Illness: Jesse SauerDaniel Hurst is a 55 y.o. male who presents today for electrophysiology evaluation.   History of history of CAD by notes diffuse LAD disease in 2014, morbid obesity, tobacco use, OSA/hypoventilatory syndrome, non-compliance. History of atrial flutter and SVT.  Presenting today for ablation of SVT.   Today, he denies symptoms of palpitations, chest pain, shortness of breath, orthopnea, PND, lower extremity edema, claudication, dizziness, presyncope, syncope, bleeding, or neurologic sequela. The patient is tolerating medications without difficulties and is otherwise without complaint today.        Past Medical History:  Diagnosis Date  . Acute coronary syndrome (HCC) 02/22/2013   Small MI vs demand ischemia in the setting of PSVT  . Chronic systolic CHF (congestive heart failure) (HCC)   . History of atrial flutter 10/2015   Pt considering ablation procedure by Dr. Graciela HusbandsKlein as of 12/07/15.  Marland Kitchen. History of PSVT (paroxysmal supraventricular tachycardia)   . Obesity, morbid (HCC) 12/21/2012  . OSA on CPAP 2017   Sleep study set for 12/17/15  . Tobacco dependence 10/2015        Past Surgical History:  Procedure Laterality Date  . CARDIAC CATHETERIZATION  02/2013   Diffuse distal LAD dz, no intervention  . CARDIOVERSION N/A 10/25/2015   Procedure: CARDIOVERSION;  Surgeon: Orpah CobbAjay Kadakia, MD;  Location: [facility] ENDOSCOPY;  Service: Cardiovascular;  Laterality: N/A;  . LEFT HEART CATHETERIZATION WITH CORONARY ANGIOGRAM N/A 02/24/2013   Procedure: LEFT HEART CATHETERIZATION WITH CORONARY ANGIOGRAM;  Surgeon: Robynn PaneMohan N Harwani, MD;  Location: [facility] CATH LAB;   Service: Cardiovascular;  Laterality: N/A;  . TEE WITHOUT CARDIOVERSION N/A 10/25/2015   Procedure: TRANSESOPHAGEAL ECHOCARDIOGRAM (TEE);  Surgeon: Orpah CobbAjay Kadakia, MD;  [facility];  Service: Cardiovascular;  Laterality: N/A;  . TRANSTHORACIC ECHOCARDIOGRAM  02/2013; 10/2015   2014: LVH, EF 45-50 %.  2017: EF 45-50%.  Hypokinesis of the anteroseptal myocardium.  Moderate mitral regurg.           Current Outpatient Prescriptions  Medication Sig Dispense Refill  . apixaban (ELIQUIS) 5 MG TABS tablet Take 1 tablet (5 mg total) by mouth 2 (two) times daily. 60 tablet 0  . furosemide (LASIX) 80 MG tablet Take 80 mg by mouth daily.  3  . metoprolol succinate (TOPROL-XL) 50 MG 24 hr tablet Take 1 tablet (50 mg total) by mouth daily. 30 tablet 0  . potassium chloride SA (K-DUR,KLOR-CON) 20 MEQ tablet Take 20 mEq by mouth daily.  3   No current facility-administered medications for this visit.     Allergies:   Review of patient's allergies indicates no known allergies.   Social History:  The patient  reports that he quit smoking about 2 years ago. His smoking use included Cigarettes. He has a 30.00 pack-year smoking history. He has never used smokeless tobacco. He reports that he does not drink alcohol or use drugs.   Family History:  The patient's family history includes Cancer in his father and mother; Drug abuse in his son.    ROS:  Please see the history of present illness.   Otherwise, review of systems  is positive for none.   All other systems are reviewed and negative.    PHYSICAL EXAM: Vitals:   02/28/16 0937  BP: 111/69  Pulse: 81  Resp: 18  Temp: 98.1 F (36.7 C)    GEN: Well nourished, well developed, in no acute distress  HEENT: normal  Neck: no JVD, carotid bruits, or masses Cardiac: RRR; no murmurs, rubs, or gallops,no edema  Respiratory:  clear to auscultation bilaterally, normal work of breathing GI: soft, nontender, nondistended, + BS MS:  no deformity or atrophy  Skin: warm and dry Neuro:  Strength and sensation are intact Psych: euthymic mood, full affect  EKG:  EKG is ordered today. Personal review of the ekg ordered shows sinus rhythm, rate 88   Recent Labs: 10/22/2015: B Natriuretic Peptide 72.8; TSH 1.462 10/28/2015: Hemoglobin 11.8; Platelets 223 11/02/2015: BUN 13; Creatinine, Ser 0.98; Potassium 4.5; Sodium 143    Lipid Panel  Labs(Brief)          Component Value Date/Time   CHOL 123 10/23/2015 0353   TRIG 92 10/23/2015 0353   HDL 24 (L) 10/23/2015 0353   CHOLHDL 5.1 10/23/2015 0353   VLDL 18 10/23/2015 0353   LDLCALC 81 10/23/2015 0353          Wt Readings from Last 3 Encounters:  12/10/15 (!) 447 lb 6.4 oz (202.9 kg)  12/05/15 (!) 447 lb (202.8 kg)  12/04/15 (!) 447 lb (202.8 kg)      Other studies Reviewed: Additional studies/ records that were reviewed today include: TTE 10/23/15  Review of the above records today demonstrates:  - Very technically difficult study due to body habitus. Definity contrast images were performed. There is grossly hyperdynamic LV function wtih EF around 70%. The study is inadequate to exclude wall motion abnormalities or for valvular evaulation. Consider TEE for better image quality if necessary.  TEE 10/25/15 - Left ventricle: Systolic function was mildly reduced. The estimated ejection fraction was in the range of 45% to 50%. Hypokinesis of the anteroseptal myocardium. - Mitral valve: There was moderate regurgitation, with multiple jets directed centrally. - Left atrium: The atrium was moderately dilated. No evidence of thrombus in the atrial cavity or appendage. - Right atrium: The atrium was mildly dilated. No evidence of thrombus in the atrial cavity or appendage. - Atrial septum: No defect or patent foramen ovale was identified. - Superior vena cava: The study excluded a thrombus.  ASSESSMENT AND PLAN:  1.  Atrial  flutter with RVR: Currently on Eliquis, has not missed doses.  Risks and benefits of ablation discussed.  Risks include but not limited to bleeding, infection, tamponade, heart block.  Patient understands the risks and has agreed to the procedure.  This patients CHA2DS2-VASc Score and unadjusted Ischemic Stroke Rate (% per year) is equal to 0.6 % stroke rate/year from a score of 1  Above score calculated as 1 point each if present [CHF, HTN, DM, Vascular=MI/PAD/Aortic Plaque, Age if 65-74, or Male] Above score calculated as 2 points each if present [Age > 75, or Stroke/TIA/TE]  2. SVT: Has had episodes of narrow complex tachycardia. Further discuss the risks and benefits of ablation.   3. Morbid obesity: Encouraged weight loss. Referral for bariatric surgery  4. OSA: Compliant with CPAP  Will Camnitz, MD 02/28/2016 12:32 PM

## 2016-02-28 NOTE — Anesthesia Procedure Notes (Signed)
Procedure Name: Intubation Date/Time: 02/28/2016 1:26 PM Performed by: Charm BargesBUTLER, Graysyn Bache R Pre-anesthesia Checklist: Patient identified, Emergency Drugs available, Suction available and Patient being monitored Patient Re-evaluated:Patient Re-evaluated prior to inductionOxygen Delivery Method: Circle System Utilized Preoxygenation: Pre-oxygenation with 100% oxygen Intubation Type: IV induction Ventilation: Mask ventilation with difficulty Laryngoscope Size: Glidescope and 4 Grade View: Grade I Tube type: Oral Tube size: 8.0 mm Number of attempts: 1 Airway Equipment and Method: Stylet and Oral airway Placement Confirmation: ETT inserted through vocal cords under direct vision,  positive ETCO2 and breath sounds checked- equal and bilateral Secured at: 24 cm Tube secured with: Tape Dental Injury: Teeth and Oropharynx as per pre-operative assessment

## 2016-02-28 NOTE — Progress Notes (Addendum)
Site area: RFV x 3 Site Prior to Removal:  Level 0 Pressure Applied For:25 min Manual:  yes  Patient Status During Pull:  stable Post Pull Site:  Level Post Pull Instructions Given: yes  Post Pull Pulses Present: palpable Dressing Applied: tegaderm  Bedrest begins @ 1720 till 2320 Comments:

## 2016-02-28 NOTE — Discharge Summary (Signed)
ELECTROPHYSIOLOGY PROCEDURE DISCHARGE SUMMARY    Patient ID: Jesse Hurst,  MRN: [id number], DOB/AGE: 08-05-1960 55 y.o.  Admit date: 02/28/2016 Discharge date: 02/29/16  Primary Care Physician: Jeoffrey MassedMCGOWEN,PHILIP H, MD  Primary Cardiologist: Dr. Sharyn LullHarwani Electrophysiologist: Dr. Graciela HusbandsKlein  Primary Discharge Diagnosis:  1. Atrial flutter status post ablation this admission     CHA2DS2Vasc is at least 1, on Eliquis 2. SVT  Secondary Discharge Diagnosis:  1. CAD 2. OSA/hypoventilatory syndrome 3. Morbid obesity   No Known Allergies   Procedures This Admission: 1.  Electrophysiology study and radiofrequency catheter ablation on 02/28/16 by Dr Elberta Fortisamnitz.   This study demonstrated CONCLUSIONS:   1.Sinus rhythm upon presentation.   2. Successful radiofrequency ablation of atrial flutter along the cavotricuspid isthmus with complete bidirectional isthmus block achieved.   3. Ablation of AVNRT with junctional beats and 1 beat of heart block with continued PR>RR  3. No inducible arrhythmias following ablation.   4. No early apparent complications  Brief HPI: Jesse SauerDaniel Geving is a 55 y.o. male with a past medical history as outlined above.  He has documented atrial flutter, SVT. He has been appropriately anticoagulated for >4 weeks.  Risks, benefits, and alternatives to ablation were reviewed with the patient who wished to proceed.   Hospital Course:  The patient was admitted and underwent EPS/RFCA with details as outlined above.  He was monitored on telemetry overnight which demonstrated SR.  Groin procedure site without complication.  He was examined by Dr Elberta Fortisamnitz who considered him stable for discharge to home. Routine follow up has been arranged.  Wound care and restrictions were reviewed with the patient prior to discharge.   Physical Exam: Vitals:   02/28/16 2030 02/28/16 2300 02/28/16 2340 02/29/16 0606  BP: (!) 101/58 (!) 107/57 108/61 (!) 111/54  Pulse: 79 82 80 79  Resp:  18 20  20   Temp:  98.3 F (36.8 C) 97.8 F (36.6 C) 98 F (36.7 C)  TempSrc:  Oral Oral Oral  SpO2:  91% 92% 93%  Weight:      Height:        GEN- The patient is well appearing, alert and oriented x 3 today.   HEENT: normocephalic, atraumatic; sclera clear, conjunctiva pink; hearing intact; oropharynx clear; neck supple, no JVP Lymph- no cervical lymphadenopathy Lungs- Clear to ausculation bilaterally, normal work of breathing.  No wheezes, rales, rhonchi Heart- Regular rate and rhythm, no murmurs, rubs or gallops, PMI not laterally displaced GI- soft, non-tender, non-distended, bowel sounds present, no hepatosplenomegaly Extremities- no clubbing, cyanosis, or edema; DP/PT/radial pulses 2+ bilaterally, groin without hematoma/bruit MS- no significant deformity or atrophy Skin- warm and dry, no rash or lesion Psych- euthymic mood, full affect Neuro- strength and sensation are intact   Labs:   Lab Results  Component Value Date   WBC 9.5 02/19/2016   HGB 13.7 02/19/2016   HCT 41.1 02/19/2016   MCV 89.5 02/19/2016   PLT 303 02/19/2016   No results for input(s): NA, K, CL, CO2, BUN, CREATININE, CALCIUM, PROT, BILITOT, ALKPHOS, ALT, AST, GLUCOSE in the last 168 hours.  Invalid input(s): LABALBU  Discharge Medications:  Allergies as of 02/29/2016   No Known Allergies     Medication List    TAKE these medications   apixaban 5 MG Tabs tablet Commonly known as:  ELIQUIS Take 1 tablet (5 mg total) by mouth 2 (two) times daily.   furosemide 80 MG tablet Commonly known as:  LASIX Take 80 mg by mouth  daily.   metoprolol succinate 50 MG 24 hr tablet Commonly known as:  TOPROL-XL Take 1 tablet (50 mg total) by mouth 2 (two) times daily.   potassium chloride SA 20 MEQ tablet Commonly known as:  K-DUR,KLOR-CON Take 20 mEq by mouth daily.       Disposition: Home Discharge Instructions    Diet - low sodium heart healthy    Complete by:  As directed    Increase activity slowly     Complete by:  As directed      Follow-up Information    Kathrina Crosley Jorja LoaMartin Tiajuana Leppanen, MD Follow up on 03/31/2016.   Specialty:  Cardiology Why:  2:30PM Contact information: [address] [place] [address] [phone]           Duration of Discharge Encounter: Greater than 30 minutes including physician time.  SignedFrancis Dowse, Renee Ursuy, PA-C 02/29/2016 8:04 AM  I have seen and examined this patient with Francis Dowseenee Ursuy.  Agree with above, note added to reflect my findings.  On exam, regular rhythm, no murmurs, lungs clear. Had ablation for atrial flutter and AVNRT. Plan for discharge today with follow up in one month.  Sahaana Weitman continue anticoagulation.    Rock Sobol M. Halaina Vanduzer MD 02/29/2016 8:22 AM

## 2016-02-28 NOTE — Discharge Instructions (Signed)
No driving for 1 week. No lifting over 5 lbs for 1 week. No vigorous or sexual activity for 1 week. You may return to work on 03/06/16. Keep procedure site clean & dry. If you notice increased pain, swelling, bleeding or pus, call/return!  You may shower, but no soaking baths/hot tubs/pools for 1 week.

## 2016-02-29 ENCOUNTER — Encounter (HOSPITAL_COMMUNITY): Payer: Self-pay | Admitting: Cardiology

## 2016-02-29 ENCOUNTER — Ambulatory Visit: Payer: BLUE CROSS/BLUE SHIELD | Admitting: Family Medicine

## 2016-02-29 DIAGNOSIS — Z79899 Other long term (current) drug therapy: Secondary | ICD-10-CM | POA: Diagnosis not present

## 2016-02-29 DIAGNOSIS — I483 Typical atrial flutter: Secondary | ICD-10-CM

## 2016-02-29 DIAGNOSIS — I4892 Unspecified atrial flutter: Secondary | ICD-10-CM | POA: Diagnosis not present

## 2016-02-29 DIAGNOSIS — Z7901 Long term (current) use of anticoagulants: Secondary | ICD-10-CM | POA: Diagnosis not present

## 2016-02-29 DIAGNOSIS — I471 Supraventricular tachycardia: Secondary | ICD-10-CM | POA: Diagnosis not present

## 2016-03-01 NOTE — Anesthesia Postprocedure Evaluation (Signed)
Anesthesia Post Note  Patient: [name]  Procedure(s) Performed: Procedure(s) (LRB): A-Flutter Ablation (N/A)  Patient location during evaluation: Cath Lab Anesthesia Type: General Level of consciousness: awake, sedated and oriented Pain management: pain level controlled Vital Signs Assessment: post-procedure vital signs reviewed and stable Respiratory status: spontaneous breathing, nonlabored ventilation, respiratory function stable and patient connected to nasal cannula oxygen Cardiovascular status: blood pressure returned to baseline and stable Postop Assessment: no signs of nausea or vomiting Anesthetic complications: no    Last Vitals:  Vitals:   02/29/16 0606 02/29/16 0816  BP: (!) 111/54 130/72  Pulse: 79 75  Resp: 20   Temp: 36.7 C     Last Pain:  Vitals:   02/29/16 0606  TempSrc: Oral                 Safire Gordin,JAMES TERRILL

## 2016-03-04 ENCOUNTER — Ambulatory Visit: Payer: BLUE CROSS/BLUE SHIELD | Admitting: Cardiology

## 2016-03-31 ENCOUNTER — Encounter: Payer: Self-pay | Admitting: Cardiology

## 2016-03-31 ENCOUNTER — Ambulatory Visit (INDEPENDENT_AMBULATORY_CARE_PROVIDER_SITE_OTHER): Payer: BLUE CROSS/BLUE SHIELD | Admitting: Cardiology

## 2016-03-31 VITALS — BP 122/80 | HR 80 | Ht 75.0 in | Wt >= 6400 oz

## 2016-03-31 DIAGNOSIS — Z8679 Personal history of other diseases of the circulatory system: Secondary | ICD-10-CM | POA: Diagnosis not present

## 2016-03-31 DIAGNOSIS — Z9889 Other specified postprocedural states: Secondary | ICD-10-CM

## 2016-03-31 DIAGNOSIS — I483 Typical atrial flutter: Secondary | ICD-10-CM | POA: Diagnosis not present

## 2016-03-31 NOTE — Patient Instructions (Signed)
Medication Instructions:    Your physician recommends that you continue on your current medications as directed. Please refer to the Current Medication list given to you today.  --- If you need a refill on your cardiac medications before your next appointment, please call your pharmacy. ---  Labwork:  None ordered  Testing/Procedures:  None ordered  Follow-Up:  You have been referred to Dr. Armanda Magicraci Turner for sleep apnea.     No follow up is needed at this time with Dr. Elberta Fortisamnitz.  He will see you on an as needed basis.  Thank you for choosing [facility]!!   Dory HornSherri Shawntez Dickison, RN [phone]

## 2016-03-31 NOTE — Progress Notes (Addendum)
Electrophysiology Office Note   Date:  03/31/2016   ID:  Jesse Hurst, DOB 05/12/1960, MRN [id number]  PCP:  [name], MD  Cardiologist:  Sharyn Lull Primary Electrophysiologist: Doran Durand, MD    Chief Complaint  Patient presents with  . Follow-up    post AFlutter ablation     History of Present Illness: Jesse Hurst is a 56 y.o. male who presents today for electrophysiology evaluation.   History of  history of CAD by notes diffuse LAD disease in 2014, morbid obesity, tobacco use, OSA/hypoventilatory syndrome, non-compliance. Found to have both atrial flutter and SVT. Head atrial flutter ablation on 02/28/16. 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   Today, he denies symptoms of palpitations, chest pain, shortness of breath, orthopnea, PND, lower extremity edema, claudication, dizziness, presyncope, syncope, bleeding, or neurologic sequela. The patient is tolerating medications without difficulties and is otherwise without complaint today.    Past Medical History:  Diagnosis Date  . Acute coronary syndrome (HCC) 02/22/2013   Small MI vs demand ischemia in the setting of PSVT  . Chronic systolic CHF (congestive heart failure) (HCC)   . History of atrial flutter 10/2015   Pt considering ablation procedure by Dr. Graciela Husbands as of 12/07/15.  Marland Kitchen History of PSVT (paroxysmal supraventricular tachycardia)    Pt considering ablation procedure by Dr. Graciela Husbands.  . Obesity, morbid (HCC) 12/21/2012   Cardiology referred him to bariatric surgery 11/2015  . OSA on CPAP 2017   Sleep study 12/2015  . Tobacco dependence 10/2015   Past Surgical History:  Procedure Laterality Date  . CARDIAC CATHETERIZATION  02/2013   Diffuse distal LAD dz, no intervention  . CARDIOVERSION N/A 10/25/2015   Procedure: CARDIOVERSION;  Surgeon: Orpah Cobb, MD;  Location: [facility] ENDOSCOPY;  Service: Cardiovascular;  Laterality: N/A;  . ELECTROPHYSIOLOGIC STUDY N/A 02/28/2016   Procedure: A-Flutter Ablation;  Surgeon: Will Jorja Loa, MD;  Location: [facility] INVASIVE CV LAB;  Service: Cardiovascular;  Laterality: N/A;  . LEFT HEART CATHETERIZATION WITH CORONARY ANGIOGRAM N/A 02/24/2013   Procedure: LEFT HEART CATHETERIZATION WITH CORONARY ANGIOGRAM;  Surgeon: Robynn Pane, MD;  Location: [facility] CATH LAB;  Service: Cardiovascular;  Laterality: N/A;  . Sleep Study  12/2015   Severe OSA w/desat into mid 70s.  . TEE WITHOUT CARDIOVERSION N/A 10/25/2015   Procedure: TRANSESOPHAGEAL ECHOCARDIOGRAM (TEE);  Surgeon: Orpah Cobb, MD;  Location: [facility] ENDOSCOPY;  Service: Cardiovascular;  Laterality: N/A;  . TRANSTHORACIC ECHOCARDIOGRAM  02/2013; 10/2015   2014: LVH, EF 45-50 %.  2017: EF 45-50%.  Hypokinesis of the anteroseptal myocardium.  Moderate mitral regurg.     Current Outpatient Prescriptions  Medication Sig Dispense Refill  . apixaban (ELIQUIS) 5 MG TABS tablet Take 1 tablet (5 mg total) by mouth 2 (two) times daily. 60 tablet 0  . furosemide (LASIX) 80 MG tablet Take 80 mg by mouth daily.  3  . metoprolol succinate (TOPROL-XL) 50 MG 24 hr tablet Take 1 tablet (50 mg total) by mouth 2 (two) times daily. 60 tablet 6  . potassium chloride SA (K-DUR,KLOR-CON) 20 MEQ tablet Take 20 mEq by mouth daily.  3   No current facility-administered medications for this visit.     Allergies:   Patient has no known allergies.   Social History:  The patient  reports that he quit smoking about 3 years ago. His smoking use included Cigarettes. He has a 30.00 pack-year smoking history. He has never used smokeless tobacco. He reports that  he does not drink alcohol or use drugs.   Family History:  The patient's family history includes Cancer in his father and mother; Drug abuse in his son.    ROS:  Please see the history of present illness.   Otherwise, review of systems is positive for none.   All other systems are reviewed and negative.    PHYSICAL EXAM: VS:  BP 122/80   Pulse 80    Ht 6\' 3"  (1.905 m)   Wt (!) 436 lb 3.2 oz (197.9 kg)   BMI 54.52 kg/m  , BMI Body mass index is 54.52 kg/m. GEN: Well nourished, well developed, in no acute distress  HEENT: normal  Neck: no JVD, carotid bruits, or masses Cardiac: RRR; no murmurs, rubs, or gallops,no edema  Respiratory:  clear to auscultation bilaterally, normal work of breathing GI: soft, nontender, nondistended, + BS MS: no deformity or atrophy  Skin: warm and dry Neuro:  Strength and sensation are intact Psych: euthymic mood, full affect  EKG:  EKG is ordered today. Personal review of the ekg ordered shows sinus rhythm, rate 80   Recent Labs: 10/22/2015: B Natriuretic Peptide 72.8; TSH 1.462 02/19/2016: BUN 12; Creat 0.83; Hemoglobin 13.7; Platelets 303; Potassium 4.5; Sodium 138    Lipid Panel     Component Value Date/Time   CHOL 123 10/23/2015 0353   TRIG 92 10/23/2015 0353   HDL 24 (L) 10/23/2015 0353   CHOLHDL 5.1 10/23/2015 0353   VLDL 18 10/23/2015 0353   LDLCALC 81 10/23/2015 0353     Wt Readings from Last 3 Encounters:  03/31/16 (!) 436 lb 3.2 oz (197.9 kg)  02/28/16 (!) 380 lb (172.4 kg)  12/17/15 (!) 447 lb (202.8 kg)      Other studies Reviewed: Additional studies/ records that were reviewed today include: TTE 10/23/15  Review of the above records today demonstrates:  - Very technically difficult study due to body habitus. Definity   contrast images were performed. There is grossly hyperdynamic LV   function wtih EF around 70%. The study is inadequate to exclude   wall motion abnormalities or for valvular evaulation. Consider   TEE for better image quality if necessary.  TEE 10/25/15 - Left ventricle: Systolic function was mildly reduced. The   estimated ejection fraction was in the range of 45% to 50%.   Hypokinesis of the anteroseptal myocardium. - Mitral valve: There was moderate regurgitation, with multiple   jets directed centrally. - Left atrium: The atrium was moderately  dilated. No evidence of   thrombus in the atrial cavity or appendage. - Right atrium: The atrium was mildly dilated. No evidence of   thrombus in the atrial cavity or appendage. - Atrial septum: No defect or patent foramen ovale was identified. - Superior vena cava: The study excluded a thrombus.  ASSESSMENT AND PLAN:  1.  Atrial flutter with RVR: Currently on Eliquis. Ablation 02/28/16. He has had no further episodes of palpitations. As it is one month after his ablation, we'll plan to hold his Eliquis.  This patients CHA2DS2-VASc Score and unadjusted Ischemic Stroke Rate (% per year) is equal to 0.2 % stroke rate/year from a score of 0  Above score calculated as 1 point each if present [CHF, HTN, DM, Vascular=MI/PAD/Aortic Plaque, Age if 65-74, or Male] Above score calculated as 2 points each if present [Age > 75, or Stroke/TIA/TE]  2. 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 Successful ablation.  3. Morbid obesity: Encouraged weight loss. Referral for bariatric  surgery  4. OSA: Compliant with CPAP. He was sent home from the hospital with oxygen, and is unclear whether or not he needs that. Will refer him to sleep medicine for further discussions.    Current medicines are reviewed at length with the patient today.   The patient does not have concerns regarding his medicines.  The following changes were made today:  Stop Eliquis  Labs/ tests ordered today include:  No orders of the defined types were placed in this encounter.    Disposition:   FU with Will Camnitz 3 months  Signed, Will Jorja LoaMartin Camnitz, MD  03/31/2016 2:43 PM     [facility] [address] [place] [address] [phone] (office) [phone] (fax)

## 2016-05-02 ENCOUNTER — Encounter: Payer: Self-pay | Admitting: *Deleted

## 2016-05-12 NOTE — Progress Notes (Deleted)
Cardiology Office Note    Date:  05/12/2016   ID:  Jesse SauerDaniel Sypher, DOB 01/07/1961, MRN [id number]  PCP:  Jeoffrey MassedMCGOWEN,PHILIP H, MD  Cardiologist:  Armanda Magicraci Turner, MD   No chief complaint on file.   History of Present Illness:  Jesse Hurst is a 56 y.o. male ***    Past Medical History:  Diagnosis Date  . Acute coronary syndrome (HCC) 02/22/2013   Small MI vs demand ischemia in the setting of PSVT  . Chronic systolic CHF (congestive heart failure) (HCC)   . History of atrial flutter 10/2015   Ablation of atrial flutter and SVT done 02/2016.    Marland Kitchen. History of PSVT (paroxysmal supraventricular tachycardia)    Ablation done 02/2016.  . Obesity, morbid (HCC) 12/21/2012   Cardiology referred him to bariatric surgery 11/2015  . OSA on CPAP 2017   Sleep study 12/2015  . Tobacco dependence 10/2015    Past Surgical History:  Procedure Laterality Date  . CARDIAC CATHETERIZATION  02/2013   Diffuse distal LAD dz, no intervention  . CARDIOVERSION N/A 10/25/2015   Procedure: CARDIOVERSION;  Surgeon: Orpah CobbAjay Kadakia, MD;  [facility];  Service: Cardiovascular;  Laterality: N/A;  . ELECTROPHYSIOLOGIC STUDY N/A 02/28/2016   Procedure: A-Flutter Ablation;  Surgeon: Will Jorja LoaMartin Camnitz, MD;  [facility]: [facility] INVASIVE CV LAB;  Service: Cardiovascular;  Laterality: N/A;  . LEFT HEART CATHETERIZATION WITH CORONARY ANGIOGRAM N/A 02/24/2013   Procedure: LEFT HEART CATHETERIZATION WITH CORONARY ANGIOGRAM;  Surgeon: Robynn PaneMohan N Harwani, MD;  Location: [facility] CATH LAB;  Service: Cardiovascular;  Laterality: N/A;  . Sleep Study  12/2015   Severe OSA w/desat into mid 70s.  . TEE WITHOUT CARDIOVERSION N/A 10/25/2015   Procedure: TRANSESOPHAGEAL ECHOCARDIOGRAM (TEE);  Surgeon: Orpah CobbAjay Kadakia, MD;  [facility];  Service: Cardiovascular;  Laterality: N/A;  . TRANSTHORACIC ECHOCARDIOGRAM  02/2013; 10/2015   2014: LVH, EF 45-50 %.  2017: EF 45-50%.  Hypokinesis of the anteroseptal myocardium.  Moderate mitral  regurg.    Current Medications: No outpatient prescriptions have been marked as taking for the 05/13/16 encounter (Appointment) with Quintella Reichertraci R Turner, MD.    Allergies:   Patient has no known allergies.   Social History   Social History  . Marital status: Married    Spouse name: N/A  . Number of children: N/A  . Years of education: N/A   Social History Main Topics  . Smoking status: Former Smoker    Packs/day: 1.00    Years: 30.00    Types: Cigarettes    Quit date: 02/22/2013  . Smokeless tobacco: Never Used  . Alcohol use No  . Drug use: No  . Sexual activity: Not on file   Other Topics Concern  . Not on file   Social History Narrative   Married, 4 children.  3 living (son died of drug overdose).   Occupation: plumber--owns plumbing business.  [place]  [employer].   Education: GED.   Hobbies: Fish and golf.   +Cigarettes: 45 pack-yr hx marlboro lights--quit 02/2013 after PSVT/ACS   Alcohol: none   Drugs: none     Family History:  The patient's ***family history includes Cancer in his father and mother; Drug abuse in his son.   ROS:   Please see the history of present illness.    ROS All other systems reviewed and are negative.  No flowsheet data found.     PHYSICAL EXAM:   VS:  There were no vitals taken for this  visit.   GEN: Well nourished, well developed, in no acute distress  HEENT: normal  Neck: no JVD, carotid bruits, or masses Cardiac: ***RRR; no murmurs, rubs, or gallops,no edema.  Intact distal pulses bilaterally.  Respiratory:  clear to auscultation bilaterally, normal work of breathing GI: soft, nontender, nondistended, + BS MS: no deformity or atrophy  Skin: warm and dry, no rash Neuro:  Alert and Oriented x 3, Strength and sensation are intact Psych: euthymic mood, full affect  Wt Readings from Last 3 Encounters:  03/31/16 (!) 436 lb 3.2 oz (197.9 kg)  02/28/16 (!) 380 lb (172.4 kg)  12/17/15 (!) 447 lb (202.8 kg)       Studies/Labs Reviewed:   EKG:  EKG is*** ordered today.  The ekg ordered today demonstrates ***  Recent Labs: 10/22/2015: B Natriuretic Peptide 72.8; TSH 1.462 02/19/2016: BUN 12; Creat 0.83; Hemoglobin 13.7; Platelets 303; Potassium 4.5; Sodium 138   Lipid Panel    Component Value Date/Time   CHOL 123 10/23/2015 0353   TRIG 92 10/23/2015 0353   HDL 24 (L) 10/23/2015 0353   CHOLHDL 5.1 10/23/2015 0353   VLDL 18 10/23/2015 0353   LDLCALC 81 10/23/2015 0353    Additional studies/ records that were reviewed today include:  ***    ASSESSMENT:    No diagnosis found.   PLAN:  In order of problems listed above:  1. ***    Medication Adjustments/Labs and Tests Ordered: Current medicines are reviewed at length with the patient today.  Concerns regarding medicines are outlined above.  Medication changes, Labs and Tests ordered today are listed in the Patient Instructions below.  There are no Patient Instructions on file for this visit.   Signed, Armanda Magic, MD  05/12/2016 9:49 PM    [facility] [address] [place], [place], [place]  [address] Phone: [phone]; Fax: [phone]

## 2016-05-13 ENCOUNTER — Ambulatory Visit: Payer: BLUE CROSS/BLUE SHIELD | Admitting: Cardiology

## 2016-05-14 ENCOUNTER — Encounter: Payer: Self-pay | Admitting: Cardiology

## 2016-06-18 ENCOUNTER — Other Ambulatory Visit: Payer: Self-pay | Admitting: Cardiology

## 2016-11-02 IMAGING — CT CT ANGIO CHEST
2 of 6 series · 18 of 36 positions shown · IV contrast (omnipaque)
Comparison: 02/23/2013

CLINICAL DATA: Shortness of breath.  Elevated D-dimer.

EXAM:
CT ANGIOGRAPHY CHEST WITH CONTRAST
TECHNIQUE: Multidetector CT imaging of the chest was performed using the
standard protocol during bolus administration of intravenous
contrast. Multiplanar CT image reconstructions and MIPs were
obtained to evaluate the vascular anatomy.
CONTRAST:  100 cc Omnipaque 370 intravenous

[Series 6: coronal mpr · coronal · 0.58mm/px · 1 of 173 slices shown]
[im 87/173  mediastinal]
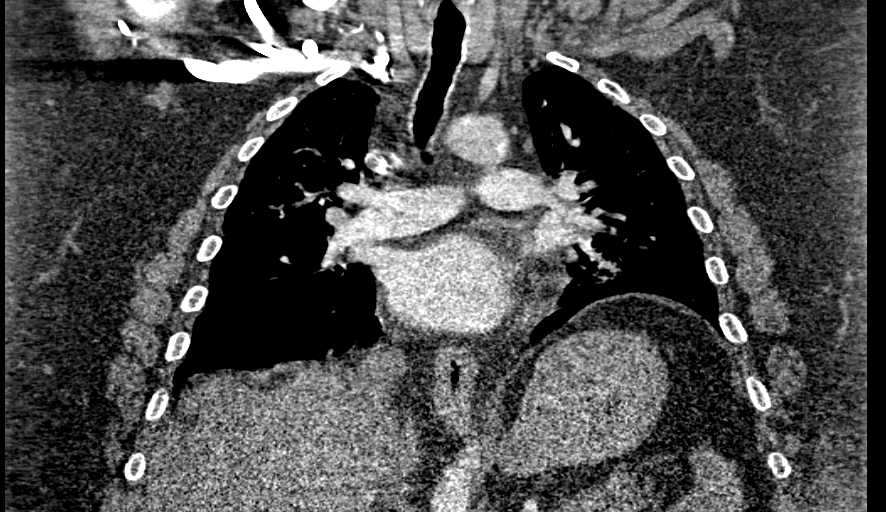

[Series 11: thins for pacs · axial · 0.82mm/px · z∈[+1333,+1601]mm · 17 of 298 slices shown]
[im 15/298  lung]
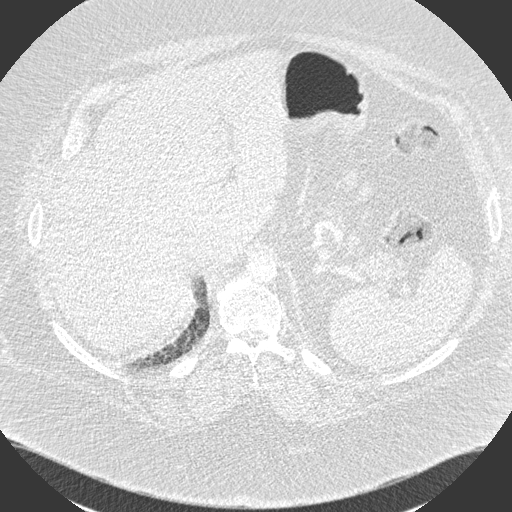
[im 30/298  mediastinal]
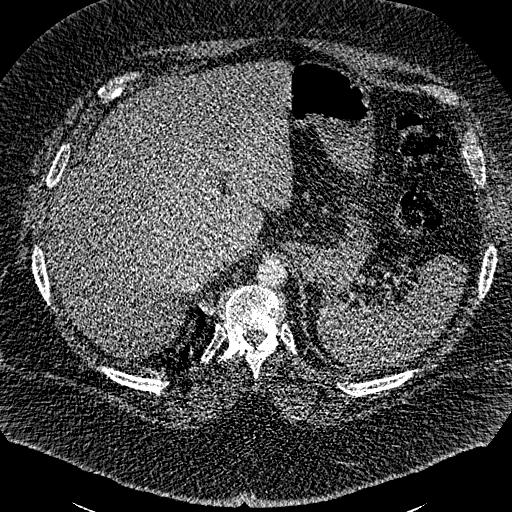
[im 45/298  lung]
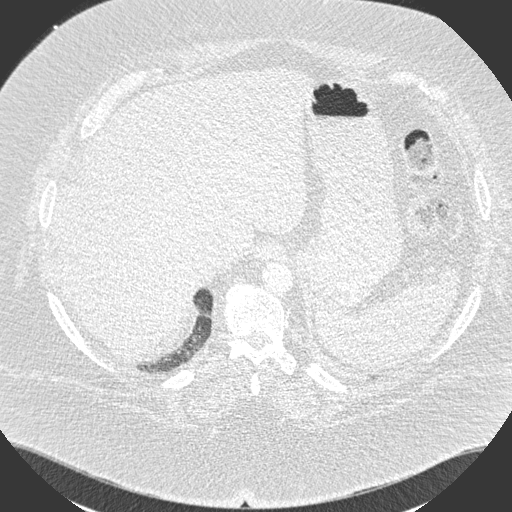
[im 60/298  mediastinal]
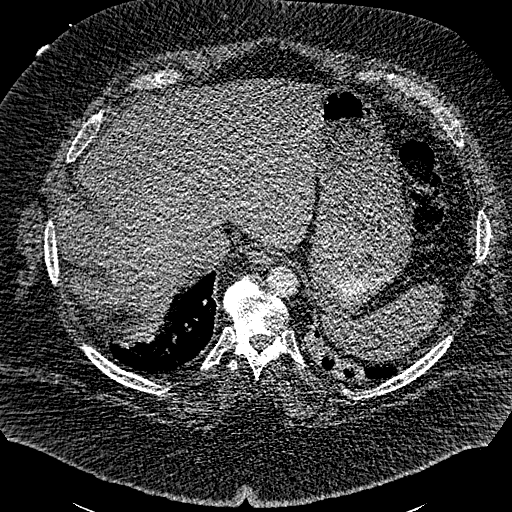
[im 90/298  lung]
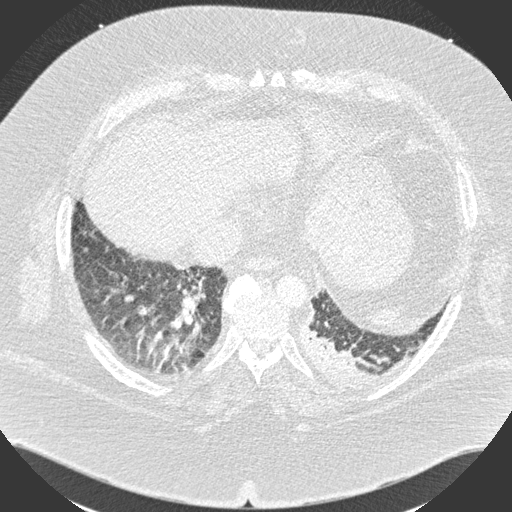
[im 104/298  mediastinal]
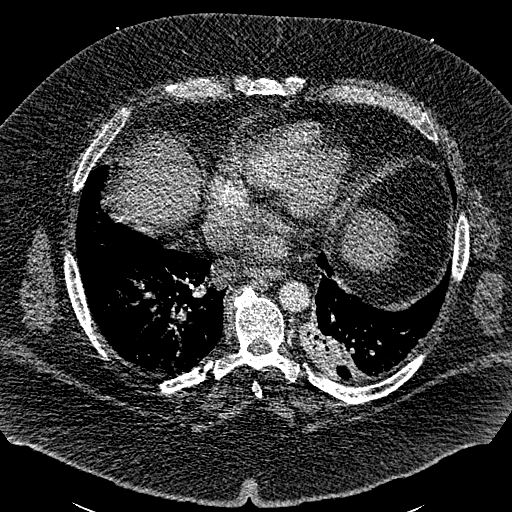
[im 119/298  lung]
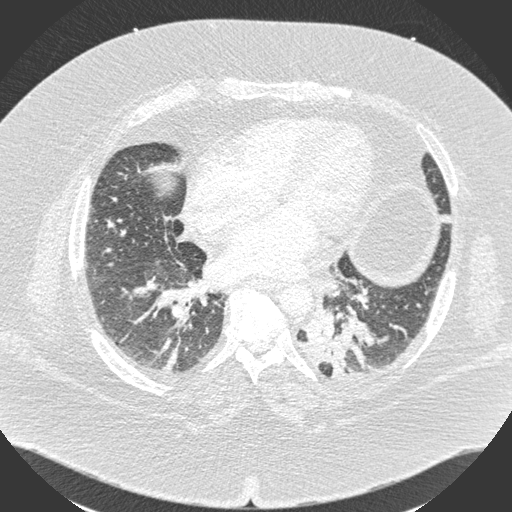
[im 134/298  mediastinal]
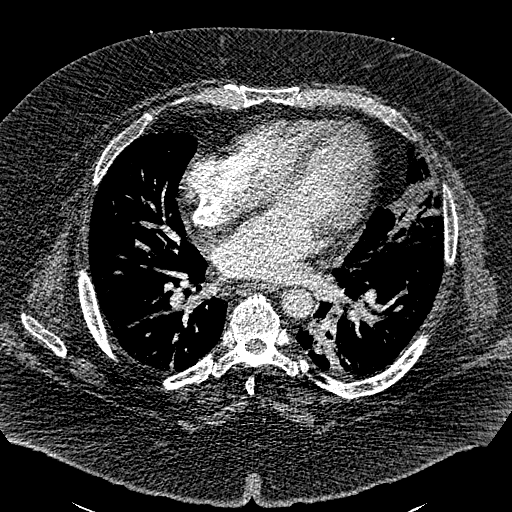
[im 149/298  lung]
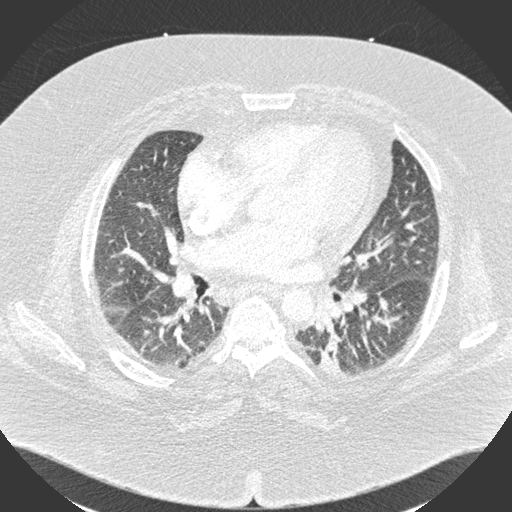
[im 164/298  mediastinal]
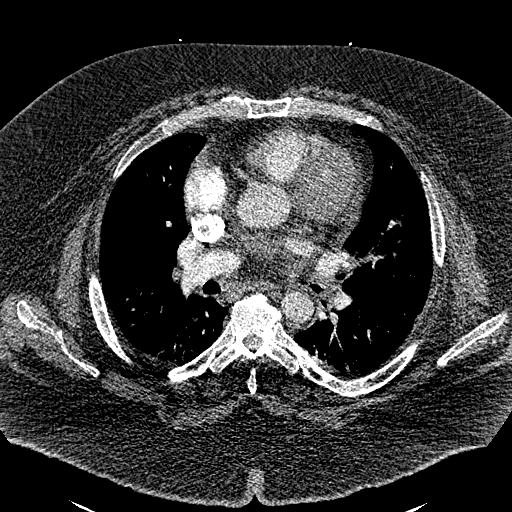
[im 179/298  lung]
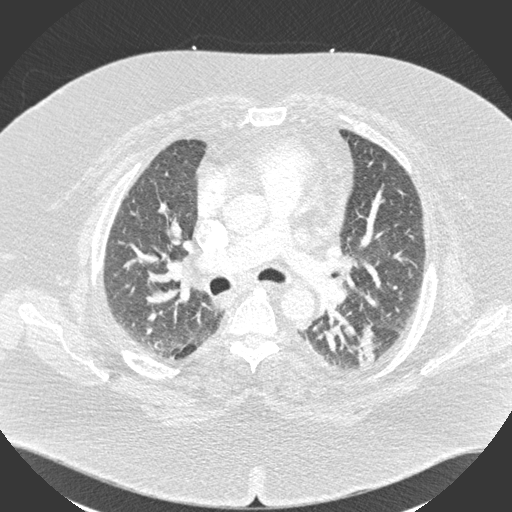
[im 194/298  mediastinal]
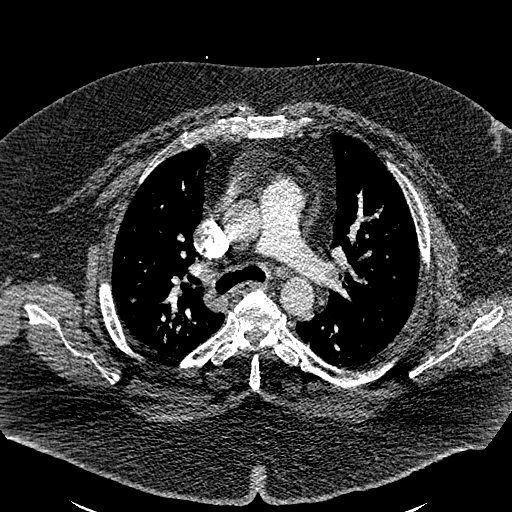
[im 208/298  lung]
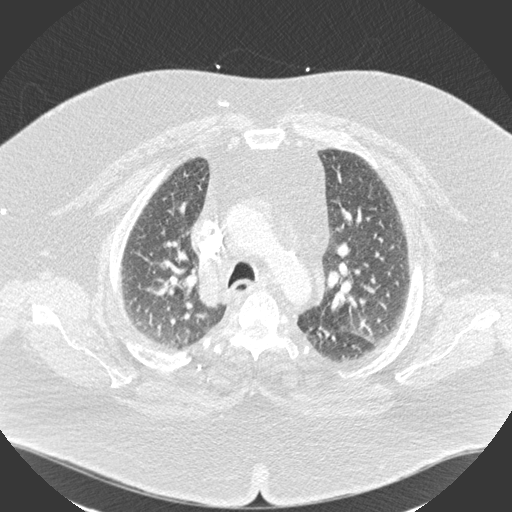
[im 238/298  mediastinal]
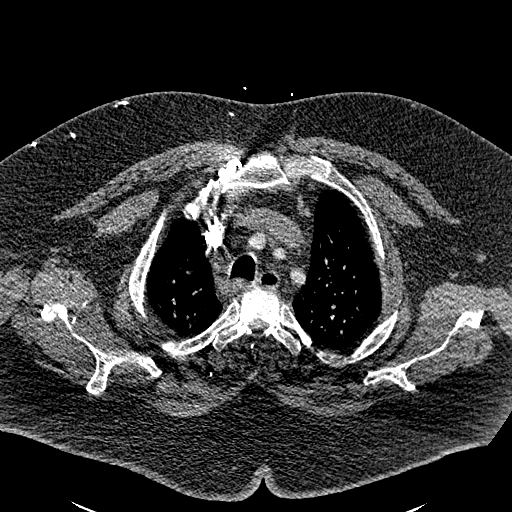
[im 253/298  lung]
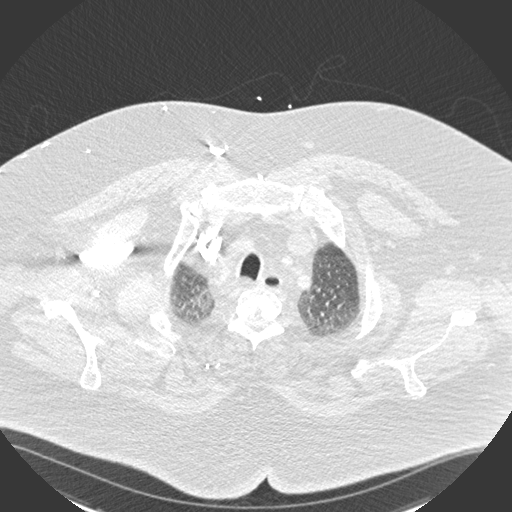
[im 268/298  mediastinal]
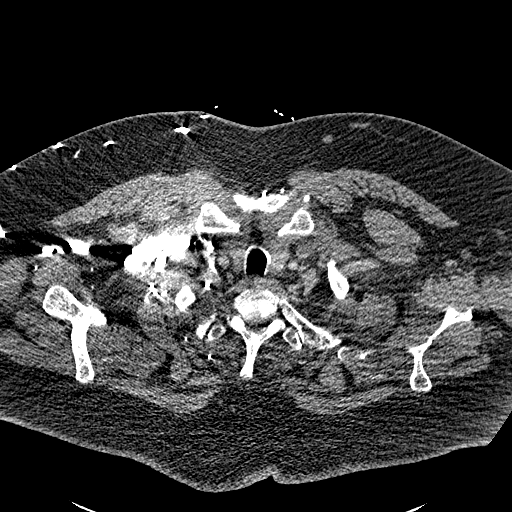
[im 283/298  lung]
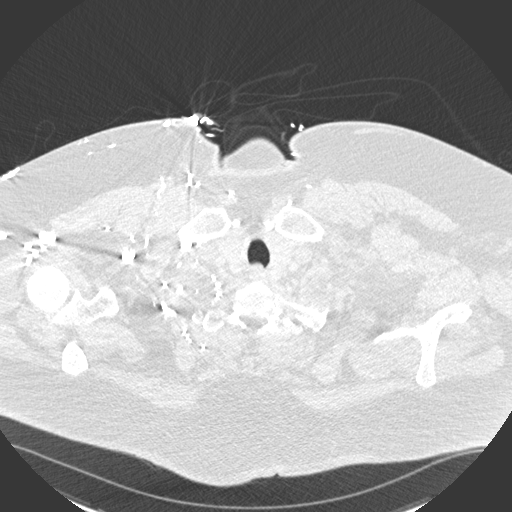

[18 of 36 positions shown; findings below may reference images not displayed]

FINDINGS: Cardiovascular: Normal heart size. No pericardial effusion. Negative
thoracic aorta when accounting for calcification at ligamentum
arteriosum. Evaluation of the pulmonary arteries is limited by
patient size, bolus dispersion, and intermittent motion. This is
likely best obtainable study under the circumstances. No visualized
pulmonary embolism.

Mediastinum: Stable benign appearance of mediastinal lymph nodes.

Lungs/Pleura: Streaky opacities with volume loss in the basilar
lungs, greater on the left where there is multi segment lower lobe
atelectasis. No edema, consolidation, effusion, or pneumothorax.
Diffuse airway thickening.

Upper abdomen: No acute findings. Partly seen rounded structure in
the left upper quadrant posteriorly was also seen previously. This
could be a 25 mm adrenal adenoma.

Musculoskeletal: No chest wall mass or suspicious bone lesions
identified. Bridging lower thoracic osteophytes

Review of the MIP images confirms the above findings.
IMPRESSION: 1. No evidence of pulmonary embolism. Evaluation of peripheral
vessels is limited by patient factors.
2. Airway thickening and multi segment atelectasis.

## 2016-12-31 ENCOUNTER — Ambulatory Visit (INDEPENDENT_AMBULATORY_CARE_PROVIDER_SITE_OTHER): Payer: BLUE CROSS/BLUE SHIELD

## 2016-12-31 DIAGNOSIS — Z23 Encounter for immunization: Secondary | ICD-10-CM | POA: Diagnosis not present

## 2017-03-16 ENCOUNTER — Other Ambulatory Visit: Payer: Self-pay | Admitting: Cardiology

## 2017-05-15 ENCOUNTER — Other Ambulatory Visit: Payer: Self-pay | Admitting: Cardiology

## 2017-05-15 NOTE — Telephone Encounter (Signed)
Okay to refuse as patient is prn follow up with Dr Elberta Fortisamnitz? Please advise. Thanks, MI

## 2017-05-19 NOTE — Telephone Encounter (Signed)
Spoke to wife (DPR on file)  Dr. Sharyn LullHarwani will be refilling this medication for the patient.

## 2022-02-20 DIAGNOSIS — E119 Type 2 diabetes mellitus without complications: Secondary | ICD-10-CM | POA: Diagnosis not present

## 2022-02-20 DIAGNOSIS — I4892 Unspecified atrial flutter: Secondary | ICD-10-CM | POA: Diagnosis not present

## 2022-02-20 DIAGNOSIS — I10 Essential (primary) hypertension: Secondary | ICD-10-CM | POA: Diagnosis not present

## 2022-02-20 DIAGNOSIS — I2510 Atherosclerotic heart disease of native coronary artery without angina pectoris: Secondary | ICD-10-CM | POA: Diagnosis not present

## 2023-02-20 DIAGNOSIS — I4892 Unspecified atrial flutter: Secondary | ICD-10-CM | POA: Diagnosis not present

## 2023-02-20 DIAGNOSIS — I2510 Atherosclerotic heart disease of native coronary artery without angina pectoris: Secondary | ICD-10-CM | POA: Diagnosis not present

## 2023-02-20 DIAGNOSIS — I10 Essential (primary) hypertension: Secondary | ICD-10-CM | POA: Diagnosis not present

## 2023-02-20 DIAGNOSIS — E119 Type 2 diabetes mellitus without complications: Secondary | ICD-10-CM | POA: Diagnosis not present

## 2023-02-23 DIAGNOSIS — E119 Type 2 diabetes mellitus without complications: Secondary | ICD-10-CM | POA: Diagnosis not present

## 2023-02-23 DIAGNOSIS — I10 Essential (primary) hypertension: Secondary | ICD-10-CM | POA: Diagnosis not present

## 2023-02-23 DIAGNOSIS — E785 Hyperlipidemia, unspecified: Secondary | ICD-10-CM | POA: Diagnosis not present

## 2023-04-01 DIAGNOSIS — E785 Hyperlipidemia, unspecified: Secondary | ICD-10-CM | POA: Diagnosis not present

## 2023-04-01 DIAGNOSIS — E119 Type 2 diabetes mellitus without complications: Secondary | ICD-10-CM | POA: Diagnosis not present

## 2023-04-01 DIAGNOSIS — I10 Essential (primary) hypertension: Secondary | ICD-10-CM | POA: Diagnosis not present

## 2023-04-02 DIAGNOSIS — D1730 Benign lipomatous neoplasm of skin and subcutaneous tissue of unspecified sites: Secondary | ICD-10-CM | POA: Diagnosis not present

## 2023-04-23 DIAGNOSIS — D485 Neoplasm of uncertain behavior of skin: Secondary | ICD-10-CM | POA: Diagnosis not present

## 2023-09-02 DIAGNOSIS — E119 Type 2 diabetes mellitus without complications: Secondary | ICD-10-CM | POA: Diagnosis not present

## 2023-09-02 DIAGNOSIS — I2510 Atherosclerotic heart disease of native coronary artery without angina pectoris: Secondary | ICD-10-CM | POA: Diagnosis not present

## 2023-09-02 DIAGNOSIS — I10 Essential (primary) hypertension: Secondary | ICD-10-CM | POA: Diagnosis not present

## 2023-09-02 DIAGNOSIS — I4892 Unspecified atrial flutter: Secondary | ICD-10-CM | POA: Diagnosis not present

## 2023-12-07 ENCOUNTER — Encounter: Payer: Self-pay | Admitting: Family Medicine

## 2023-12-07 ENCOUNTER — Ambulatory Visit: Admitting: Family Medicine

## 2023-12-07 VITALS — BP 120/78 | HR 69 | Temp 97.7°F | Ht 74.5 in | Wt 345.0 lb

## 2023-12-07 DIAGNOSIS — Z125 Encounter for screening for malignant neoplasm of prostate: Secondary | ICD-10-CM | POA: Diagnosis not present

## 2023-12-07 DIAGNOSIS — Z0000 Encounter for general adult medical examination without abnormal findings: Secondary | ICD-10-CM | POA: Diagnosis not present

## 2023-12-07 DIAGNOSIS — Z7984 Long term (current) use of oral hypoglycemic drugs: Secondary | ICD-10-CM

## 2023-12-07 DIAGNOSIS — E119 Type 2 diabetes mellitus without complications: Secondary | ICD-10-CM

## 2023-12-07 DIAGNOSIS — E7800 Pure hypercholesterolemia, unspecified: Secondary | ICD-10-CM

## 2023-12-07 DIAGNOSIS — I5022 Chronic systolic (congestive) heart failure: Secondary | ICD-10-CM

## 2023-12-07 LAB — COMPREHENSIVE METABOLIC PANEL WITH GFR
ALT: 12 U/L (ref 0–53)
AST: 11 U/L (ref 0–37)
Albumin: 4.2 g/dL (ref 3.5–5.2)
Alkaline Phosphatase: 68 U/L (ref 39–117)
BUN: 12 mg/dL (ref 6–23)
CO2: 29 meq/L (ref 19–32)
Calcium: 9.4 mg/dL (ref 8.4–10.5)
Chloride: 100 meq/L (ref 96–112)
Creatinine, Ser: 0.89 mg/dL (ref 0.40–1.50)
GFR: 91.48 mL/min (ref >60.00)
Glucose, Bld: 94 mg/dL (ref 70–99)
Potassium: 5 meq/L (ref 3.5–5.1)
Sodium: 138 meq/L (ref 135–145)
Total Bilirubin: 0.4 mg/dL (ref 0.2–1.2)
Total Protein: 7.3 g/dL (ref 6.0–8.3)

## 2023-12-07 LAB — HEMOGLOBIN A1C: Hgb A1c MFr Bld: 6.7 % — ABNORMAL HIGH (ref 4.6–6.5)

## 2023-12-07 LAB — CBC
HCT: 44.4 % (ref 39.0–52.0)
Hemoglobin: 14.6 g/dL (ref 13.0–17.0)
MCHC: 32.8 g/dL (ref 30.0–36.0)
MCV: 87.5 fl (ref 78.0–100.0)
Platelets: 298 K/uL (ref 150.0–400.0)
RBC: 5.07 Mil/uL (ref 4.22–5.81)
RDW: 14.2 % (ref 11.5–15.5)
WBC: 10.7 K/uL — ABNORMAL HIGH (ref 4.0–10.5)

## 2023-12-07 LAB — LIPID PANEL
Cholesterol: 148 mg/dL (ref 0–200)
HDL: 29.7 mg/dL — ABNORMAL LOW (ref >39.00)
LDL Cholesterol: 82 mg/dL (ref 0–99)
NonHDL: 118.36
Total CHOL/HDL Ratio: 5
Triglycerides: 181 mg/dL — ABNORMAL HIGH (ref 0.0–149.0)
VLDL: 36.2 mg/dL (ref 0.0–40.0)

## 2023-12-07 LAB — MICROALBUMIN / CREATININE URINE RATIO
Creatinine,U: 44.5 mg/dL
Microalb Creat Ratio: UNDETERMINED mg/g (ref 0.0–30.0)
Microalb, Ur: <0.7 mg/dL

## 2023-12-07 LAB — PSA: PSA: 1.23 ng/mL (ref 0.10–4.00)

## 2023-12-07 MED ORDER — DAPAGLIFLOZIN PROPANEDIOL 5 MG PO TABS: 5.0000 mg | ORAL_TABLET | Freq: Every day | ORAL | 3 refills | Status: AC

## 2023-12-07 NOTE — Progress Notes (Signed)
 Office Note 12/07/2023  CC:  Chief Complaint  Patient presents with   Establish Care   Jesse Hurst is a 64 y.o.  male who is here to reestablish care, Patient's most recent primary MD: None. Old records in [facility] EMR were reviewed prior to or during today's visit.   I last saw him 11/30/2015. A/P as of that visit: 1) Chronic systolic CHF: stable.  The current medical regimen is effective;  continue present plan and medications. He remains compliant with diet and meds. Encouraged complete smoking cessation--pt declines medication aid/assistance for this.   2) Atrial flutter: s/p cardioversion in hosp. Continue eliquis , f/u with Dr. Fernande.  INTERIM HX: He is followed by his cardiologist Dr. Levern. He says he was diagnosed with diabetes about a year ago by Dr. Levern and was started on metformin 500 mg twice a day. At a recent follow-up visit he says Dr. Levern prescribed him Farxiga .  He says no lab work was done at that time.  Patient has not filled the Farxiga  prescription yet.  Of note, his current medications are Toprol -XL 50 mg twice a day and metformin 500 mg twice a day. He says he is no longer on Eliquis  and is not on an aspirin .  He does continue to smoke.  He does not drink alcohol. He is very active, says not limited at all while walking. 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  Past Medical History:  Diagnosis Date   Acute coronary syndrome (HCC) 02/22/2013   Small MI vs demand ischemia in the setting of PSVT   Chronic systolic CHF (congestive heart failure) (HCC)    History of atrial flutter 10/2015   Ablation of atrial flutter and SVT done 02/2016.     History of PSVT (paroxysmal supraventricular tachycardia)    Ablation done 02/2016.   Obesity, morbid (HCC) 12/21/2012   Cardiology referred him to bariatric surgery 11/2015   OSA on CPAP 2017   Sleep study 12/2015   Tobacco dependence 10/2015    Past Surgical History:  Procedure Laterality  Date   CARDIAC CATHETERIZATION  02/2013   Diffuse distal LAD dz, no intervention   CARDIOVERSION N/A 10/25/2015   Procedure: CARDIOVERSION;  Surgeon: Salena Negri, MD;  Location: [facility] ENDOSCOPY;  Service: Cardiovascular;  Laterality: N/A;   ELECTROPHYSIOLOGIC STUDY N/A 02/28/2016   Procedure: A-Flutter Ablation;  Surgeon: Will Gladis Norton, MD;  Location: [facility] INVASIVE CV LAB;  Service: Cardiovascular;  Laterality: N/A;   LEFT HEART CATHETERIZATION WITH CORONARY ANGIOGRAM N/A 02/24/2013   Procedure: LEFT HEART CATHETERIZATION WITH CORONARY ANGIOGRAM;  Surgeon: Rober LOISE Levern, MD;  Location: [facility] CATH LAB;  Service: Cardiovascular;  Laterality: N/A;   Sleep Study  12/2015   Severe OSA w/desat into mid 70s.   TEE WITHOUT CARDIOVERSION N/A 10/25/2015   Procedure: TRANSESOPHAGEAL ECHOCARDIOGRAM (TEE);  Surgeon: Salena Negri, MD;  Location: [facility] ENDOSCOPY;  Service: Cardiovascular;  Laterality: N/A;   TRANSTHORACIC ECHOCARDIOGRAM  02/2013; 10/2015   2014: LVH, EF 45-50 %.  2017: EF 45-50%.  Hypokinesis of the anteroseptal myocardium.  Moderate mitral regurg.    Family History  Problem Relation Age of Onset   Cancer Mother        Lung (heavy smoker)  d. 32   Cancer Father        Lung cancer (heavy smoker)  d 21   Drug abuse Son     Social History   Socioeconomic History   Marital status: Married    Spouse name: Not on  file   Number of children: Not on file   Years of education: Not on file   Highest education level: GED or equivalent  Occupational History   Not on file  Tobacco Use   Smoking status: Every Day    Current packs/day: 0.00    Average packs/day: 1 pack/day for 30.0 years (30.0 ttl pk-yrs)    Types: Cigarettes    Start date: 02/23/1983    Last attempt to quit: 02/22/2013    Years since quitting: 10.7   Smokeless tobacco: Never  Vaping Use   Vaping status: Never Used  Substance and Sexual Activity   Alcohol use: No   Drug use: No   Sexual activity: Yes  Other Topics  Concern   Not on file  Social History Narrative   Married, 4 children.  3 living (son died of drug overdose).   Occupation: plumber--owns plumbing business.  Lives in [place], [place].  [employer].   Education: GED.   Hobbies: Fish and golf.   +Cigarettes: 45 pack-yr hx marlboro lights--quit 02/2013 after PSVT/ACS   Alcohol: none   Drugs: none   Social Drivers of Corporate investment banker Strain: Low Risk  (12/06/2023)   Overall Financial Resource Strain (CARDIA)    Difficulty of Paying Living Expenses: Not hard at all  Food Insecurity: No Food Insecurity (12/06/2023)   Hunger Vital Sign    Worried About Running Out of Food in the Last Year: Never true    Ran Out of Food in the Last Year: Never true  Transportation Needs: No Transportation Needs (12/06/2023)   PRAPARE - Administrator, Civil Service (Medical): No    Lack of Transportation (Non-Medical): No  Physical Activity: Unknown (12/06/2023)   Exercise Vital Sign    Days of Exercise per Week: Patient declined    Minutes of Exercise per Session: Not on file  Stress: No Stress Concern Present (12/06/2023)   [employer] of Occupational Health - Occupational Stress Questionnaire    Feeling of Stress: Not at all  Social Connections: Moderately Isolated (12/06/2023)   Social Connection and Isolation Panel    Frequency of Communication with Friends and Family: More than three times a week    Frequency of Social Gatherings with Friends and Family: More than three times a week    Attends Religious Services: Never    Database administrator or Organizations: No    Attends Banker Meetings: Not on file    Marital Status: Married  Intimate Partner Violence: Not on file    Outpatient Encounter Medications as of 12/07/2023  Medication Sig   metFORMIN (GLUCOPHAGE) 500 MG tablet Take 500 mg by mouth 2 (two) times daily.   metoprolol  succinate (TOPROL -XL) 50 MG 24 hr tablet Take 1 tablet (50 mg total) by mouth  2 (two) times daily. Please make yearly appt with Dr. Inocencio for January. 1st attempt   [DISCONTINUED] apixaban  (ELIQUIS ) 5 MG TABS tablet Take 1 tablet (5 mg total) by mouth 2 (two) times daily.   [DISCONTINUED] furosemide  (LASIX ) 80 MG tablet Take 80 mg by mouth daily.   [DISCONTINUED] potassium chloride  SA (K-DUR,KLOR-CON ) 20 MEQ tablet Take 20 mEq by mouth daily.   No facility-administered encounter medications on file as of 12/07/2023.    No Known Allergies  Review of Systems  Constitutional:  Negative for appetite change, chills, fatigue and fever.  HENT:  Negative for congestion, dental problem, ear pain and sore throat.   Eyes:  Negative for discharge, redness and visual disturbance.  Respiratory:  Negative for cough, chest tightness, shortness of breath and wheezing.   Cardiovascular:  Negative for chest pain, palpitations and leg swelling.  Gastrointestinal:  Negative for abdominal pain, blood in stool, diarrhea, nausea and vomiting.  Genitourinary:  Negative for difficulty urinating, dysuria, flank pain, frequency, hematuria and urgency.  Musculoskeletal:  Negative for arthralgias, back pain, joint swelling, myalgias and neck stiffness.  Skin:  Negative for pallor and rash.  Neurological:  Negative for dizziness, speech difficulty, weakness and headaches.  Hematological:  Negative for adenopathy. Does not bruise/bleed easily.  Psychiatric/Behavioral:  Negative for confusion and sleep disturbance. The patient is not nervous/anxious.     PE; Blood pressure 120/78, pulse 69, temperature 97.7 F (36.5 C), temperature source Oral, height 6' 2.5 (1.892 m), weight (!) 345 lb (156.5 kg), SpO2 97%. Body mass index is 43.7 kg/m.  Physical Exam  Gen: Alert, well appearing.  Patient is oriented to person, place, time, and situation. AFFECT: pleasant, lucid thought and speech. ENT: Ears: EACs clear, normal epithelium.  TMs with good light reflex and landmarks bilaterally.  Eyes: no  injection, icteris, swelling, or exudate.  EOMI, PERRLA. Nose: no drainage or turbinate edema/swelling.  No injection or focal lesion.  Mouth: lips without lesion/swelling.  Oral mucosa pink and moist.  Dentition intact and without obvious caries or gingival swelling.  Oropharynx without erythema, exudate, or swelling.  Neck: supple/nontender.  No LAD, mass, or TM.  Carotid pulses 2+ bilaterally, without bruits. CV: RRR, no m/r/g.   LUNGS: CTA bilat, nonlabored resps, good aeration in all lung fields. ABD: soft, NT, ND, BS normal.  No hepatospenomegaly or mass.  No bruits. EXT: no clubbing, cyanosis, or edema.  Musculoskeletal: no joint swelling, erythema, warmth, or tenderness.  ROM of all joints intact. Skin - no sores or suspicious lesions or rashes or color changes Foot exam -  no swelling, tenderness or skin or vascular lesions. Color and temperature is normal. Sensation is intact. Peripheral pulses are palpable. Toenails are normal.  Pertinent labs:  Last CBC Lab Results  Component Value Date   WBC 9.5 02/19/2016   HGB 13.7 02/19/2016   HCT 41.1 02/19/2016   MCV 89.5 02/19/2016   MCH 29.8 02/19/2016   RDW 14.4 02/19/2016   PLT 303 02/19/2016   Last metabolic panel Lab Results  Component Value Date   GLUCOSE 129 (H) 02/19/2016   NA 138 02/19/2016   K 4.5 02/19/2016   CL 105 02/19/2016   CO2 26 02/19/2016   BUN 12 02/19/2016   CREATININE 0.83 02/19/2016   GFR 84.41 11/02/2015   CALCIUM  8.8 02/19/2016   PROT 7.0 02/23/2013   ALBUMIN 3.3 (L) 02/23/2013   BILITOT 0.5 02/23/2013   ALKPHOS 71 02/23/2013   AST 20 02/23/2013   ALT 18 02/23/2013   ANIONGAP 10 10/28/2015   Last lipids Lab Results  Component Value Date   CHOL 123 10/23/2015   HDL 24 (L) 10/23/2015   LDLCALC 81 10/23/2015   TRIG 92 10/23/2015   CHOLHDL 5.1 10/23/2015   Last thyroid functions Lab Results  Component Value Date   TSH 1.462 10/22/2015   ASSESSMENT AND PLAN:   New patient,  reestablishing care.  1.  Diabetes without complication. Will continue metformin 500 twice daily and recheck hemoglobin A1c today as well as urine microalbumin/creatinine. Monitor renal function today. He just got an eye exam. Feet exam normal today.  #2 health maintenance exam: Reviewed age and  gender appropriate health maintenance issues (prudent diet, regular exercise, health risks of tobacco and excessive alcohol, use of seatbelts, fire alarms in home, use of sunscreen).  Also reviewed age and gender appropriate health screening as well as vaccine recommendations. Vaccines: Declines. Labs: PSA, CBC, c-Met, hemoglobin A1c. Prostate ca screening: Obtain PSA today. Colon ca screening: He declines. Lung cancer screening: Declines.  An After Visit Summary was printed and given to the patient.  No follow-ups on file.  Signed:  Gerlene Hockey, MD           12/07/2023

## 2023-12-10 ENCOUNTER — Ambulatory Visit: Payer: Self-pay | Admitting: Family Medicine

## 2024-01-25 DIAGNOSIS — I10 Essential (primary) hypertension: Secondary | ICD-10-CM | POA: Diagnosis not present

## 2024-01-25 DIAGNOSIS — I2510 Atherosclerotic heart disease of native coronary artery without angina pectoris: Secondary | ICD-10-CM | POA: Diagnosis not present

## 2024-01-25 DIAGNOSIS — I4892 Unspecified atrial flutter: Secondary | ICD-10-CM | POA: Diagnosis not present

## 2024-01-25 DIAGNOSIS — E119 Type 2 diabetes mellitus without complications: Secondary | ICD-10-CM | POA: Diagnosis not present

## 2024-03-21 ENCOUNTER — Ambulatory Visit: Admitting: Family Medicine

## 2024-03-21 NOTE — Progress Notes (Deleted)
 OFFICE VISIT  03/21/2024  CC: No chief complaint on file.   Patient is a 64 y.o. male who presents for 74month follow-up diabetes. A/P as of last visit:  1.  Diabetes without complication. Will continue metformin 500 twice daily and recheck hemoglobin A1c today as well as urine microalbumin/creatinine. Monitor renal function today. He just got an eye exam. Feet exam normal today.  INTERIM HX: His A1c was 6.7% last visit.  I continued him on Farxiga  and metformin.  All other labs were normal. ***  Past Medical History:  Diagnosis Date   Acute coronary syndrome (HCC) 02/22/2013   Small MI vs demand ischemia in the setting of PSVT   Chronic systolic CHF (congestive heart failure) (HCC)    Diabetes mellitus without complication (HCC)    2024   History of atrial flutter 10/2015   Ablation of atrial flutter and SVT done 02/2016.     History of PSVT (paroxysmal supraventricular tachycardia)    Ablation done 02/2016.   Obesity, morbid (HCC) 12/21/2012   Cardiology referred him to bariatric surgery 11/2015   OSA on CPAP 2017   Sleep study 12/2015   Tobacco dependence 10/2015    Past Surgical History:  Procedure Laterality Date   CARDIAC CATHETERIZATION  02/2013   Diffuse distal LAD dz, no intervention   CARDIOVERSION N/A 10/25/2015   Procedure: CARDIOVERSION;  Surgeon: Salena Negri, MD;  Location: [facility] ENDOSCOPY;  Service: Cardiovascular;  Laterality: N/A;   ELECTROPHYSIOLOGIC STUDY N/A 02/28/2016   Procedure: A-Flutter Ablation;  Surgeon: Will Gladis Norton, MD;  [facility]: [facility] INVASIVE CV LAB;  Service: Cardiovascular;  Laterality: N/A;   LEFT HEART CATHETERIZATION WITH CORONARY ANGIOGRAM N/A 02/24/2013   Procedure: LEFT HEART CATHETERIZATION WITH CORONARY ANGIOGRAM;  Surgeon: Rober LOISE Chroman, MD;  Location: [facility] CATH LAB;  Service: Cardiovascular;  Laterality: N/A;   Sleep Study  12/2015   Severe OSA w/desat into mid 70s.   TEE WITHOUT CARDIOVERSION N/A 10/25/2015   Procedure:  TRANSESOPHAGEAL ECHOCARDIOGRAM (TEE);  Surgeon: Salena Negri, MD;  Location: [facility] ENDOSCOPY;  Service: Cardiovascular;  Laterality: N/A;   TRANSTHORACIC ECHOCARDIOGRAM  02/2013; 10/2015   2014: LVH, EF 45-50 %.  2017: EF 45-50%.  Hypokinesis of the anteroseptal myocardium.  Moderate mitral regurg.    Outpatient Medications Prior to Visit  Medication Sig Dispense Refill   dapagliflozin  propanediol (FARXIGA ) 5 MG TABS tablet Take 1 tablet (5 mg total) by mouth daily before breakfast. 90 tablet 3   metFORMIN (GLUCOPHAGE) 500 MG tablet Take 500 mg by mouth 2 (two) times daily.     metoprolol  succinate (TOPROL -XL) 50 MG 24 hr tablet Take 1 tablet (50 mg total) by mouth 2 (two) times daily. Please make yearly appt with Dr. Norton for January. 1st attempt 60 tablet 0   No facility-administered medications prior to visit.    Allergies[1]  Review of Systems As per HPI  PE:    12/07/2023   10:21 AM 03/31/2016    2:31 PM 02/29/2016    8:16 AM  Vitals with BMI  Height 6' 2.5 6' 3   Weight 345 lbs 436 lbs 3 oz   BMI 43.72 54.6    Systolic 120 122 869  Diastolic 78 80 72  Pulse 69 80 75     Data saved with a previous flowsheet row definition     Physical Exam  ***  LABS:  Last CBC Lab Results  Component Value Date   WBC 10.7 (H) 12/07/2023   HGB 14.6 12/07/2023  HCT 44.4 12/07/2023   MCV 87.5 12/07/2023   MCH 29.8 02/19/2016   RDW 14.2 12/07/2023   PLT 298.0 12/07/2023   Last metabolic panel Lab Results  Component Value Date   GLUCOSE 94 12/07/2023   NA 138 12/07/2023   K 5.0 12/07/2023   CL 100 12/07/2023   CO2 29 12/07/2023   BUN 12 12/07/2023   CREATININE 0.89 12/07/2023   GFR 91.48 12/07/2023   CALCIUM  9.4 12/07/2023   PROT 7.3 12/07/2023   ALBUMIN 4.2 12/07/2023   BILITOT 0.4 12/07/2023   ALKPHOS 68 12/07/2023   AST 11 12/07/2023   ALT 12 12/07/2023   ANIONGAP 10 10/28/2015   Last lipids Lab Results  Component Value Date   CHOL 148 12/07/2023   HDL  29.70 (L) 12/07/2023   LDLCALC 82 12/07/2023   TRIG 181.0 (H) 12/07/2023   CHOLHDL 5 12/07/2023   Last hemoglobin A1c Lab Results  Component Value Date   HGBA1C 6.7 (H) 12/07/2023      IMPRESSION AND PLAN:  No problem-specific Assessment & Plan notes found for this encounter.   An After Visit Summary was printed and given to the patient.  FOLLOW UP: No follow-ups on file. Next CPE September 2026 Signed:  Gerlene Hockey, MD           03/21/2024     [1] No Known Allergies

## 2024-03-23 ENCOUNTER — Encounter: Payer: Self-pay | Admitting: Family Medicine
